# Patient Record
Sex: Female | Born: 1995
Health system: Southern US, Community
[De-identification: ages and names within clinical notes are randomized; demographics above are authoritative.]

## PROBLEM LIST (undated history)

## (undated) ENCOUNTER — Emergency Department (HOSPITAL_BASED_OUTPATIENT_CLINIC_OR_DEPARTMENT_OTHER): Admission: EM | Payer: Medicaid Other | Source: Home / Self Care

## (undated) DIAGNOSIS — D649 Anemia, unspecified: Secondary | ICD-10-CM

## (undated) DIAGNOSIS — R4586 Emotional lability: Secondary | ICD-10-CM

---

## 2015-04-12 ENCOUNTER — Encounter (HOSPITAL_BASED_OUTPATIENT_CLINIC_OR_DEPARTMENT_OTHER): Payer: Self-pay | Admitting: Emergency Medicine

## 2015-04-12 DIAGNOSIS — R42 Dizziness and giddiness: Secondary | ICD-10-CM | POA: Insufficient documentation

## 2015-04-12 NOTE — ED Notes (Signed)
Patient states that she feels dizzy and lightheaded, patient is supposed to be taking iron pills and has not. The patient has not taken these pills for a couple years.

## 2015-04-13 ENCOUNTER — Emergency Department (HOSPITAL_BASED_OUTPATIENT_CLINIC_OR_DEPARTMENT_OTHER)
Admission: EM | Admit: 2015-04-13 | Discharge: 2015-04-13 | Discharge status: Against Medical Advice | Payer: Medicaid Other | Attending: Emergency Medicine | Admitting: Emergency Medicine

## 2015-04-13 HISTORY — DX: Emotional lability: R45.86

## 2015-04-13 HISTORY — DX: Anemia, unspecified: D64.9

## 2015-04-13 NOTE — ED Notes (Signed)
Patient no longer in the waiting room. The patient called x 3 and not in waiting room

## 2016-06-14 ENCOUNTER — Encounter (HOSPITAL_BASED_OUTPATIENT_CLINIC_OR_DEPARTMENT_OTHER): Payer: Self-pay

## 2016-06-14 ENCOUNTER — Emergency Department (HOSPITAL_BASED_OUTPATIENT_CLINIC_OR_DEPARTMENT_OTHER)
Admission: EM | Admit: 2016-06-14 | Discharge: 2016-06-14 | Disposition: A | Discharge status: Home / Self Care | Payer: Medicaid Other | Attending: Emergency Medicine | Admitting: Emergency Medicine

## 2016-06-14 DIAGNOSIS — Y929 Unspecified place or not applicable: Secondary | ICD-10-CM | POA: Insufficient documentation

## 2016-06-14 DIAGNOSIS — Y990 Civilian activity done for income or pay: Secondary | ICD-10-CM | POA: Insufficient documentation

## 2016-06-14 DIAGNOSIS — S1096XA Insect bite of unspecified part of neck, initial encounter: Secondary | ICD-10-CM | POA: Insufficient documentation

## 2016-06-14 DIAGNOSIS — Y939 Activity, unspecified: Secondary | ICD-10-CM | POA: Insufficient documentation

## 2016-06-14 DIAGNOSIS — W57XXXA Bitten or stung by nonvenomous insect and other nonvenomous arthropods, initial encounter: Secondary | ICD-10-CM | POA: Insufficient documentation

## 2016-06-14 DIAGNOSIS — S60861A Insect bite (nonvenomous) of right wrist, initial encounter: Secondary | ICD-10-CM | POA: Insufficient documentation

## 2016-06-14 MED ORDER — PERMETHRIN 5 % EX CREA: TOPICAL_CREAM | CUTANEOUS | 1 refills | Status: DC

## 2016-06-14 MED FILL — PERMETHRIN 5% CREAM: 5 | 14 days supply | Qty: 60 | Fill #0

## 2016-06-14 NOTE — ED Provider Notes (Signed)
[facility]   CSN: [id number] Arrival date & time: 06/14/16  1149     History   Chief Complaint Chief Complaint  Patient presents with  . Insect Bite    HPI Melissa Sandoval is a 21 y.o. female.  The history is provided by the patient and medical records.    21 year old female here with insect bites. States she was at work and saw "little brown bumps" crawling on her. States she was bitten on the wrist in the back for back. States bites are pruritic. States she thinks they are bedbugs but is not entirely sure. She's not had any fever. She has no known allergies.  Past Medical History:  Diagnosis Date  . Anemia   . Mood swings (HCC)     There are no active problems to display for this patient.   History reviewed. No pertinent surgical history.  OB History    No data available       Home Medications    Prior to Admission medications   Not on File    Family History No family history on file.  Social History Social History  Substance Use Topics  . Smoking status: Never Smoker  . Smokeless tobacco: Never Used  . Alcohol use No     Allergies   Patient has no known allergies.   Review of Systems Review of Systems  Skin:       Insect bites  All other systems reviewed and are negative.    Physical Exam Updated Vital Signs BP 127/87 (BP Location: Left Arm)   Pulse 99   Temp 98.5 F (36.9 C) (Oral)   Resp 16   Ht 5\' 1"  (1.549 m)   Wt 63.5 kg   SpO2 100%   BMI 26.45 kg/m   Physical Exam  Constitutional: She is oriented to person, place, and time. She appears well-developed and well-nourished.  HENT:  Head: Normocephalic and atraumatic.  Mouth/Throat: Oropharynx is clear and moist.  Eyes: Conjunctivae and EOM are normal. Pupils are equal, round, and reactive to light.  Neck: Normal range of motion.  Cardiovascular: Normal rate, regular rhythm and normal heart sounds.   Pulmonary/Chest: Effort normal and breath  sounds normal.  Abdominal: Soft. Bowel sounds are normal.  Musculoskeletal: Normal range of motion.  Neurological: She is alert and oriented to person, place, and time.  Skin: Skin is warm and dry.  Small bug bites noted to right wrist and left neck; there are signs of excoriation but no superimposed infection or cellulitis, no lesions on the palms or soles  Psychiatric: She has a normal mood and affect.  Nursing note and vitals reviewed.    ED Treatments / Results  Labs (all labs ordered are listed, but only abnormal results are displayed) Labs Reviewed - No data to display  EKG  EKG Interpretation None       Radiology No results found.  Procedures Procedures (including critical care time)  Medications Ordered in ED Medications - No data to display   Initial Impression / Assessment and Plan / ED Course  I have reviewed the triage vital signs and the nursing notes.  Pertinent labs & imaging results that were available during my care of the patient were reviewed by me and considered in my medical decision making (see chart for details).  21 year old female here with possible bug bite exposure. She has bites of the right wrist and left neck but there are no signs of superimposed infection or  cellulitis. Will treat with permethrin. Recommended Benadryl to help with itching.  Encouraged to wash sheets and other linens in hot water at home.  Discussed plan with patient, she acknowledged understanding and agreed with plan of care.  Return precautions given for new or worsening symptoms.  Final Clinical Impressions(s) / ED Diagnoses   Final diagnoses:  Bug bite, initial encounter    New Prescriptions New Prescriptions   PERMETHRIN (ELIMITE) 5 % CREAM    Apply to entire body other than face - let sit for 14 hours then wash off, may repeat in 1 week if still having symptoms     Garlon HatchetLisa M Roseann Kees, PA-C 06/14/16 1219    Cathren LaineKevin Steinl, MD 06/17/16 [phone]

## 2016-06-14 NOTE — ED Triage Notes (Signed)
[facility]

## 2016-06-14 NOTE — ED Notes (Signed)
ED Provider at bedside. 

## 2016-06-14 NOTE — Discharge Instructions (Signed)
Use medication as directed. Wash your sheets and towels in hot water at home. May need to repeat in 1 week if still having symptoms.

## 2016-08-03 MED FILL — PERMETHRIN 5% CREAM: 5 | 14 days supply | Qty: 60 | Fill #1

## 2016-11-19 ENCOUNTER — Encounter (HOSPITAL_BASED_OUTPATIENT_CLINIC_OR_DEPARTMENT_OTHER): Payer: Self-pay | Admitting: Emergency Medicine

## 2016-11-19 ENCOUNTER — Emergency Department (HOSPITAL_BASED_OUTPATIENT_CLINIC_OR_DEPARTMENT_OTHER)
Admission: EM | Admit: 2016-11-19 | Discharge: 2016-11-20 | Disposition: A | Discharge status: Home / Self Care | Payer: Medicaid Other | Attending: Emergency Medicine | Admitting: Emergency Medicine

## 2016-11-19 DIAGNOSIS — B9689 Other specified bacterial agents as the cause of diseases classified elsewhere: Secondary | ICD-10-CM

## 2016-11-19 DIAGNOSIS — N939 Abnormal uterine and vaginal bleeding, unspecified: Secondary | ICD-10-CM | POA: Insufficient documentation

## 2016-11-19 DIAGNOSIS — N760 Acute vaginitis: Secondary | ICD-10-CM | POA: Insufficient documentation

## 2016-11-19 LAB — PREGNANCY, URINE: Preg Test, Ur: NEGATIVE

## 2016-11-19 NOTE — ED Notes (Signed)
Alert, NAD, calm, interactive, resps e/u, speaking in clear complete sentences, no dyspnea noted, concerned about fibroids.  EDPA into room, prior to RN assessment, see PA notes, orders received. .Marland Kitchen

## 2016-11-19 NOTE — ED Provider Notes (Signed)
[facility]   CSN: [id number] Arrival date & time: 11/19/16  2242  By signing my name below, I, Melissa Sandoval, attest that this documentation has been prepared under the direction and in the presence of [name], PA-C. Electronically Signed: Linna Sandoval, Scribe. 11/19/2016. 11:55 PM.  History   Chief Complaint Chief Complaint  Patient presents with  . Vaginal Bleeding   The history is provided by the patient. No language interpreter was used.    HPI Comments: Melissa Sandoval is a 21 y.o. female with PMHx of anemia who presents to the Emergency Department complaining of intermittent vaginal bleeding for about two weeks. Patient states that she was previously receiving Depo-Provera injections and has had irregular periods since discontinuing the injections last March. Patient also notes some intermittent episodes of abdominal pain independent of her bleeding. She is sexually active but has no concern for pregnancy. She is primarily concerned for fibroids. Patient denies vaginal discharge, dysuria, urinary frequency, nausea, vomiting, or any other associated symptoms.  Past Medical History:  Diagnosis Date  . Anemia   . Mood swings (HCC)     There are no active problems to display for this patient.   History reviewed. No pertinent surgical history.  OB History    No data available       Home Medications    Prior to Admission medications   Medication Sig Start Date End Date Taking? Authorizing Provider  permethrin (ELIMITE) 5 % cream Apply to entire body other than face - let sit for 14 hours then wash off, may repeat in 1 week if still having symptoms 06/14/16   Garlon Hatchet, PA-C    Family History History reviewed. No pertinent family history.  Social History Social History  Substance Use Topics  . Smoking status: Never Smoker  . Smokeless tobacco: Never Used  . Alcohol use No     Allergies   Patient has no known  allergies.   Review of Systems Review of Systems  Gastrointestinal: Positive for abdominal pain. Negative for nausea and vomiting.  Genitourinary: Positive for menstrual problem and vaginal bleeding. Negative for dysuria, frequency and vaginal discharge.  All other systems reviewed and are negative.  Physical Exam Updated Vital Signs LMP 11/19/2016   Physical Exam  Constitutional: She is oriented to person, place, and time. She appears well-developed and well-nourished. No distress.  HENT:  Head: Normocephalic and atraumatic.  Eyes: Conjunctivae and EOM are normal.  Neck: Neck supple. No tracheal deviation present.  Cardiovascular: Normal rate.   Pulmonary/Chest: Effort normal. No respiratory distress.  Abdominal: Soft. Bowel sounds are normal. She exhibits no distension. There is no tenderness. There is no guarding.  Genitourinary:  Genitourinary Comments: Normal external genitalia. Normal vaginal canal. Small blood in vaginal canal with no clots. Cervix is normal, closed. No CMT. No uterine or adnexal tenderness. No masses palpated.    Musculoskeletal: Normal range of motion.  Neurological: She is alert and oriented to person, place, and time.  Skin: Skin is warm and dry.  Psychiatric: She has a normal mood and affect. Her behavior is normal.  Nursing note and vitals reviewed.  ED Treatments / Results  Labs (all labs ordered are listed, but only abnormal results are displayed) Labs Reviewed  PREGNANCY, URINE    EKG  EKG Interpretation None       Radiology No results found.  Procedures Procedures (including critical care time)  COORDINATION OF CARE: 11:54 PM Discussed treatment plan with pt at  bedside and pt agreed to plan.  Medications Ordered in ED Medications - No data to display   Initial Impression / Assessment and Plan / ED Course  I have reviewed the triage vital signs and the nursing notes.  Pertinent labs & imaging results that were available  during my care of the patient were reviewed by me and considered in my medical decision making (see chart for details).     Patient in emergency department with vaginal bleeding after stopping Depo. She is having frequent and irregular periods. Some associated cramping. Patient is in no acute distress at this time. Abdomen is nontender. Slight bleeding on exam. Normal cervix. No cervical motion tenderness. No uterine tenderness or masses palpated. Question dysfunctional bleeding after stopping contraceptives versus fibroids. She is not pregnant. Will start on naproxen for pain and inflammation, will give Flagyl for bacterial vaginosis, follow-up with PCP or OB/GYN for further evaluation of dysfunctional bleeding. Patient denies any dizziness or lightheadedness, do not think she needs any evaluation for anemia. Return precautions discussed.   Vitals:   11/19/16 2330  BP: 120/65  Pulse: 78  Resp: 18  Temp: 98.5 F (36.9 C)  TempSrc: Oral  SpO2: 100%     Final Clinical Impressions(s) / ED Diagnoses   Final diagnoses:  Abnormal uterine bleeding  Bacterial vaginosis    New Prescriptions Discharge Medication List as of 11/20/2016 12:51 AM    START taking these medications   Details  metroNIDAZOLE (FLAGYL) 500 MG tablet Take 1 tablet (500 mg total) by mouth 2 (two) times daily., Starting Mon 11/20/2016, Print       I personally performed the services described in this documentation, which was scribed in my presence. The recorded information has been reviewed and is accurate.    Jaynie CrumbleKirichenko, Laconda Basich, PA-C 11/21/16 0040    Jacalyn LefevreHaviland, Julie, MD 11/23/16 [phone]

## 2016-11-19 NOTE — ED Triage Notes (Addendum)
Patient reports that she has had vaginal bleeding intermittently for the last 2 -3 weeks. Patient stopped going for her Birth Control recently. Reports that she is worried that she has a STD  - patient denies pain today but reports pain in the past. Patient is talking on the phone the entire time in triage

## 2016-11-20 LAB — WET PREP, GENITAL
Sperm: NONE SEEN
Trich, Wet Prep: NONE SEEN
YEAST WET PREP: NONE SEEN

## 2016-11-20 MED ORDER — METRONIDAZOLE 500 MG PO TABS: 500.0000 mg | ORAL_TABLET | Freq: Two times a day (BID) | ORAL | 0 refills | Status: DC

## 2016-11-20 NOTE — Discharge Instructions (Signed)
Take naproxen for any cramping and to slow down the bleeding. Take Flagyl as prescribed until all gone for bacterial vaginosis. Please focally a family doctor for further evaluation and treatment of your bleeding.

## 2016-11-21 LAB — GC/CHLAMYDIA PROBE AMP (~~LOC~~) NOT AT ARMC
Chlamydia: NEGATIVE
NEISSERIA GONORRHEA: NEGATIVE

## 2017-01-24 ENCOUNTER — Emergency Department (HOSPITAL_BASED_OUTPATIENT_CLINIC_OR_DEPARTMENT_OTHER)
Admission: EM | Admit: 2017-01-24 | Discharge: 2017-01-24 | Disposition: A | Discharge status: Home / Self Care | Payer: Self-pay | Attending: Emergency Medicine | Admitting: Emergency Medicine

## 2017-01-24 ENCOUNTER — Encounter (HOSPITAL_BASED_OUTPATIENT_CLINIC_OR_DEPARTMENT_OTHER): Payer: Self-pay | Admitting: *Deleted

## 2017-01-24 DIAGNOSIS — K0889 Other specified disorders of teeth and supporting structures: Secondary | ICD-10-CM | POA: Insufficient documentation

## 2017-01-24 MED ORDER — NAPROXEN 250 MG PO TABS
500.0000 mg | ORAL_TABLET | Freq: Once | ORAL | Status: AC
  Administered 2017-01-24: 500 mg via ORAL
  Filled 2017-01-24: qty 2

## 2017-01-24 MED ORDER — NAPROXEN 500 MG PO TABS: 500.0000 mg | ORAL_TABLET | Freq: Two times a day (BID) | ORAL | 0 refills | Status: AC

## 2017-01-24 MED ORDER — PENICILLIN V POTASSIUM 500 MG PO TABS: 500.0000 mg | ORAL_TABLET | Freq: Four times a day (QID) | ORAL | 0 refills | Status: AC

## 2017-01-24 NOTE — ED Provider Notes (Signed)
[facility]   CSN: [id number] Arrival date & time: 01/24/17  0022     History   Chief Complaint Chief Complaint  Patient presents with  . Dental Pain    HPI Melissa Sandoval is a 21 y.o. female.  HPI  This is a 21 year old female who presents with dental pain. Patient reports several month history of worsening right sided dental pain. She wears braces and had brackets removed thinking that it may be related to that. However, she has had worsening pain. Worsening of last 2 weeks. She has also previously been treated for dental infection but has not followed up with her dentist. She denies any difficulty swallowing. She is not taking anything for her pain. Currently she rates her pain a 10 out of 10.  Past Medical History:  Diagnosis Date  . Anemia   . Mood swings (HCC)     There are no active problems to display for this patient.   History reviewed. No pertinent surgical history.  OB History    No data available       Home Medications    Prior to Admission medications   Medication Sig Start Date End Date Taking? Authorizing Provider  naproxen (NAPROSYN) 500 MG tablet Take 1 tablet (500 mg total) by mouth 2 (two) times daily. 01/24/17   Zakarie Sturdivant, Mayer Masker, MD  penicillin v potassium (VEETID) 500 MG tablet Take 1 tablet (500 mg total) by mouth 4 (four) times daily. 01/24/17 01/31/17  Lilliona Blakeney, Mayer Masker, MD    Family History History reviewed. No pertinent family history.  Social History Social History  Substance Use Topics  . Smoking status: Never Smoker  . Smokeless tobacco: Never Used  . Alcohol use No     Allergies   Patient has no known allergies.   Review of Systems Review of Systems  HENT: Positive for dental problem. Negative for trouble swallowing.   All other systems reviewed and are negative.    Physical Exam Updated Vital Signs BP 112/73   Pulse 76   Temp 98.6 F (37 C)   Resp 16   Ht  (1.549 m)   Wt 73.9  kg (163 lb)   SpO2 100%   BMI 30.80 kg/m   Physical Exam  Constitutional: She is oriented to person, place, and time. She appears well-developed and well-nourished. No distress.  HENT:  Head: Normocephalic and atraumatic.  Brace is noted without brackets in place, generalized periodontal disease, no tenderness along the gumline, no obvious abscess, no trismus, no fullness under the tongue  Cardiovascular: Normal rate and regular rhythm.   Pulmonary/Chest: Effort normal. No respiratory distress.  Neurological: She is alert and oriented to person, place, and time.  Skin: Skin is warm and dry.  Psychiatric: She has a normal mood and affect.  Nursing note and vitals reviewed.    ED Treatments / Results  Labs (all labs ordered are listed, but only abnormal results are displayed) Labs Reviewed - No data to display  EKG  EKG Interpretation None       Radiology No results found.  Procedures Procedures (including critical care time)  Medications Ordered in ED Medications  naproxen (NAPROSYN) tablet 500 mg (not administered)     Initial Impression / Assessment and Plan / ED Course  I have reviewed the triage vital signs and the nursing notes.  Pertinent labs & imaging results that were available during my care of the patient were reviewed by me and considered in  my medical decision making (see chart for details).     Patient presents with dental pain. She has not followed up with dentistry. She has evidence of periodontal disease but no obvious abscess. No signs or symptoms of Ludwigs.  Recommend naproxen and antibiotics for possible underlying infection. Recommend close follow-up with dentistry.  After history, exam, and medical workup I feel the patient has been appropriately medically screened and is safe for discharge home. Pertinent diagnoses were discussed with the patient. Patient was given return precautions.   Final Clinical Impressions(s) / ED Diagnoses    Final diagnoses:  Pain, dental    New Prescriptions New Prescriptions   NAPROXEN (NAPROSYN) 500 MG TABLET    Take 1 tablet (500 mg total) by mouth 2 (two) times daily.   PENICILLIN V POTASSIUM (VEETID) 500 MG TABLET    Take 1 tablet (500 mg total) by mouth 4 (four) times daily.     Shon BatonHorton, Sanayah Munro F, MD 01/24/17 [phone]

## 2017-01-24 NOTE — ED Triage Notes (Signed)
Pt c/o dental pain x " months"

## 2017-12-11 ENCOUNTER — Encounter (HOSPITAL_BASED_OUTPATIENT_CLINIC_OR_DEPARTMENT_OTHER): Payer: Self-pay | Admitting: *Deleted

## 2017-12-11 ENCOUNTER — Emergency Department (HOSPITAL_BASED_OUTPATIENT_CLINIC_OR_DEPARTMENT_OTHER)
Admission: EM | Admit: 2017-12-11 | Discharge: 2017-12-11 | Disposition: A | Discharge status: Home / Self Care | Payer: Medicaid Other | Attending: Emergency Medicine | Admitting: Emergency Medicine

## 2017-12-11 ENCOUNTER — Other Ambulatory Visit: Payer: Self-pay

## 2017-12-11 DIAGNOSIS — R42 Dizziness and giddiness: Secondary | ICD-10-CM | POA: Diagnosis not present

## 2017-12-11 DIAGNOSIS — Z3202 Encounter for pregnancy test, result negative: Secondary | ICD-10-CM | POA: Diagnosis not present

## 2017-12-11 LAB — URINALYSIS, ROUTINE W REFLEX MICROSCOPIC
BILIRUBIN URINE: NEGATIVE
Glucose, UA: NEGATIVE mg/dL
Hgb urine dipstick: NEGATIVE
Ketones, ur: NEGATIVE mg/dL
Leukocytes, UA: NEGATIVE
NITRITE: NEGATIVE
PH: 6 (ref 5.0–8.0)
Protein, ur: NEGATIVE mg/dL
SPECIFIC GRAVITY, URINE: 1.015 (ref 1.005–1.030)

## 2017-12-11 LAB — PREGNANCY, URINE: Preg Test, Ur: NEGATIVE

## 2017-12-11 NOTE — ED Provider Notes (Signed)
Emergency Department Provider Note   I have reviewed the triage vital signs and the nursing notes.   HISTORY  Chief Complaint Dizziness   HPI Melissa Sandoval is a [age] y.o. female without significant medical problems aside from anemia the presents the emergency department today secondary to an episode of dizziness.  Patient states that she walked into work which is usually relatively cool with fans but today it was really hot after working a little bit she got dizzy.  She sat down and rested and improved.  She had no other associated symptoms.  No extremity weakness, chest pain, cough, shortness of breath or other symptoms.  She called her mom who told her to come here to be evaluated as she has a history of anemia.  No recent illnesses.  She states she is been urinating which she feels is normal.  She does have chronic constipation but no diarrhea. No other associated or modifying symptoms.    Past Medical History:  Diagnosis Date  . Anemia   . Mood swings     There are no active problems to display for this patient.   History reviewed. No pertinent surgical history.  Current Outpatient Rx  . Order #: [id number] Class: Print    Allergies Patient has no known allergies.  No family history on file.  Social History Social History   Tobacco Use  . Smoking status: Never Smoker  . Smokeless tobacco: Never Used  Substance Use Topics  . Alcohol use: No  . Drug use: No    Review of Systems  All other systems negative except as documented in the HPI. All pertinent positives and negatives as reviewed in the HPI. ____________________________________________   PHYSICAL EXAM:  VITAL SIGNS: ED Triage Vitals  Enc Vitals Group     BP 12/11/17 1954 (!) 131/95     Pulse Rate 12/11/17 1954 71     Resp 12/11/17 1954 16     Temp 12/11/17 1954 98.1 F (36.7 C)     Temp Source 12/11/17 1954 Oral     SpO2 12/11/17 1954 100 %     Weight 12/11/17 1953 173 lb (78.5 kg)   Height 12/11/17 1953 5\' 1"  (1.549 m)    Constitutional: Alert and oriented. Well appearing and in no acute distress. Eyes: Conjunctivae are normal. PERRL. EOMI. Head: Atraumatic. Nose: No congestion/rhinnorhea. Mouth/Throat: Mucous membranes are moist.  Oropharynx non-erythematous. Neck: No stridor.  No meningeal signs.   Cardiovascular: Normal rate, regular rhythm. Good peripheral circulation. Grossly normal heart sounds.   Respiratory: Normal respiratory effort.  No retractions. Lungs CTAB. Gastrointestinal: Soft and nontender. No distention.  Musculoskeletal: No lower extremity tenderness nor edema. No gross deformities of extremities. Neurologic:  Normal speech and language. No gross focal neurologic deficits are appreciated.  Skin:  Skin is warm, dry and intact. No rash noted.   ____________________________________________   LABS (all labs ordered are listed, but only abnormal results are displayed)  Labs Reviewed  PREGNANCY, URINE  URINALYSIS, ROUTINE W REFLEX MICROSCOPIC   ____________________________________________   INITIAL IMPRESSION / ASSESSMENT AND PLAN / ED COURSE  Brief episode of dizziness and heat which is since resolved.  Patient is concerned she might be anemic however her heart rate is normal, blood pressure is normal she will ambulate here without difficulty and a normal neurologic exam.  Her hemoglobin may be a little bit low but I doubt its significantly low.  At this time I think patient is stable for discharge to follow-up with  her PCP if the symptoms continue.   Pertinent labs & imaging results that were available during my care of the patient were reviewed by me and considered in my medical decision making (see chart for details).  ____________________________________________  FINAL CLINICAL IMPRESSION(S) / ED DIAGNOSES  Final diagnoses:  Lightheadedness     MEDICATIONS GIVEN DURING THIS VISIT:  Medications - No data to display   NEW  OUTPATIENT MEDICATIONS STARTED DURING THIS VISIT:  New Prescriptions   No medications on file    Note:  This note was prepared with assistance of Dragon voice recognition software. Occasional wrong-word or sound-a-like substitutions may have occurred due to the inherent limitations of voice recognition software.   Marily Memos, MD 12/11/17 2036

## 2017-12-11 NOTE — ED Triage Notes (Signed)
Dizziness since this evening. She is not dizzy now. No pain. She is ambulatory.

## 2017-12-11 NOTE — ED Notes (Signed)
ED Provider at bedside. 

## 2017-12-11 NOTE — ED Notes (Signed)
Pt reports she does not have a large water intake.

## 2018-07-25 ENCOUNTER — Emergency Department (HOSPITAL_BASED_OUTPATIENT_CLINIC_OR_DEPARTMENT_OTHER)
Admission: EM | Admit: 2018-07-25 | Discharge: 2018-07-25 | Disposition: A | Discharge status: Home / Self Care | Payer: Medicaid Other | Attending: Emergency Medicine | Admitting: Emergency Medicine

## 2018-07-25 ENCOUNTER — Other Ambulatory Visit: Payer: Self-pay

## 2018-07-25 ENCOUNTER — Encounter (HOSPITAL_BASED_OUTPATIENT_CLINIC_OR_DEPARTMENT_OTHER): Payer: Self-pay | Admitting: *Deleted

## 2018-07-25 DIAGNOSIS — L404 Guttate psoriasis: Secondary | ICD-10-CM | POA: Insufficient documentation

## 2018-07-25 DIAGNOSIS — R21 Rash and other nonspecific skin eruption: Secondary | ICD-10-CM | POA: Diagnosis present

## 2018-07-25 MED ORDER — TRIAMCINOLONE ACETONIDE 0.1 % EX OINT: 1.0000 "application " | TOPICAL_OINTMENT | Freq: Two times a day (BID) | CUTANEOUS | 0 refills | Status: AC

## 2018-07-25 MED FILL — TRIAMCINOLONE 0.1% OINTMEN: 0.1 | 14 days supply | Qty: 30 | Fill #0

## 2018-07-25 NOTE — Discharge Instructions (Addendum)
You were seen in the emergency department for a rash. You were prescribed a topical steroid. Please use this for no more than 1-2 weeks in a row. Please follow up with your regular doctor if it does not improve.

## 2018-07-25 NOTE — ED Notes (Signed)
Fine to medium rash over body x 2 weeks  Itching at times  Denies pain

## 2018-07-25 NOTE — ED Triage Notes (Signed)
Pt stated that she is breaking out everywhere, looks like ringworm she stated, but she doesn't think that it is.

## 2018-07-25 NOTE — ED Provider Notes (Signed)
[facility] EMERGENCY DEPARTMENT Provider Note   CSN: [id number] Arrival date & time: 07/25/18  1015    History   Chief Complaint No chief complaint on file.   HPI Melissa Sandoval is a 23 y.o. female.     HPI  Patient was in her usual state of health until 2 weeks ago when she noticed small 1 cm lesions under her arms that were mildly pruritic. Lesions gradually worsened over the last 2 weeks and today her boyfriend noticed them on her back which prompted her to come in to be seen.  She was recently ill with fevers and URI symptoms which started one week ago. She is no longer febrile and no longer having infectious symptoms. She has recently purchased new bedding from [employer], but otherwise denies new exposures including lotions, medicines, detergents or soaps. She reports trying topical 1%hydrocortisone lotion, vasoline, and cera-ve lotion over the rash without improvement.  Past Medical History:  Diagnosis Date  . Anemia   . Mood swings     There are no active problems to display for this patient.   History reviewed. No pertinent surgical history.   OB History   No obstetric history on file.      Home Medications    Prior to Admission medications   Medication Sig Start Date End Date Taking? Authorizing Provider  naproxen (NAPROSYN) 500 MG tablet Take 1 tablet (500 mg total) by mouth 2 (two) times daily. 01/24/17   Horton, Mayer Masker, MD  triamcinolone ointment (KENALOG) 0.1 % Apply 1 application topically 2 (two) times daily. Do not use for more than 7 days in a row. 07/25/18   Howard Pouch, MD    Family History History reviewed. No pertinent family history.  Social History Social History   Tobacco Use  . Smoking status: Never Smoker  . Smokeless tobacco: Never Used  Substance Use Topics  . Alcohol use: No  . Drug use: No     Allergies   Patient has no known allergies.   Review of Systems Review of Systems  Constitutional: Negative for  activity change and fever.  HENT: Negative for congestion and rhinorrhea.   Respiratory: Negative for shortness of breath and wheezing.   Gastrointestinal: Negative for abdominal pain, diarrhea and nausea.  Genitourinary: Negative for frequency.  Neurological: Negative for headaches.     Physical Exam Updated Vital Signs BP 110/73 (BP Location: Left Arm)   Pulse 68   Temp 98.2 F (36.8 C) (Oral)   Resp 18   Ht 5\' 1"  (1.549 m)   Wt 81.6 kg   SpO2 100%   BMI 34.01 kg/m   Physical Exam GEN: NAD, alert, cooperative, and pleasant. RESPIRATORY: Comfortable work of breathing, speaks in full sentences CV: Regular rate noted, distal extremities well perfused and warm without edema GI: Soft, nondistended SKIN: warm and dry, no rashes or lesions NEURO: II-XII grossly intact MSK: Moves 4 extremities equally PSYCH: AAOx3, appropriate affect SKIN: numerous well-circumscribed ~1-cm flat oval plaques with overlying lacy skin noted in bilateral axillary region, no excoriations or overlying erythema. No lesions between the fingers or on the palms or soles. No lesions at the wrists.  ED Treatments / Results  Labs (all labs ordered are listed, but only abnormal results are displayed) Labs Reviewed - No data to display EKG None  Radiology No results found.  Procedures Procedures (including critical care time)  Medications Ordered in ED Medications - No data to display   Initial Impression / Assessment  and Plan / ED Course  I have reviewed the triage vital signs and the nursing notes.  Pertinent labs & imaging results that were available during my care of the patient were reviewed by me and considered in my medical decision making (see chart for details).       Rash - due to numerous lesions in bilateral axilla, seems less likely fungal and after use of topical steroid KOH test may not be useful and would be difficult to obtain in the ED. 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  - triamcinolone ointment 0.1% to be used BID for no more than 2 weeks in a row - informed patient of risk of hypopigmentation with prolonged steroid use - follow up with PCP if symptoms persist or fail to improve  Final Clinical Impressions(s) / ED Diagnoses   Final diagnoses:  Guttate psoriasis    ED Discharge Orders         Ordered    triamcinolone ointment (KENALOG) 0.1 %  2 times daily     07/25/18 1130           Howard Pouch, MD 07/25/18 1137    Blane Ohara, MD 07/25/18 [phone]

## 2018-09-25 ENCOUNTER — Emergency Department (HOSPITAL_BASED_OUTPATIENT_CLINIC_OR_DEPARTMENT_OTHER)
Admission: EM | Admit: 2018-09-25 | Discharge: 2018-09-25 | Disposition: A | Discharge status: Home / Self Care | Payer: Medicaid Other | Attending: Emergency Medicine | Admitting: Emergency Medicine

## 2018-09-25 ENCOUNTER — Other Ambulatory Visit: Payer: Self-pay

## 2018-09-25 ENCOUNTER — Encounter (HOSPITAL_BASED_OUTPATIENT_CLINIC_OR_DEPARTMENT_OTHER): Payer: Self-pay | Admitting: Emergency Medicine

## 2018-09-25 ENCOUNTER — Emergency Department (HOSPITAL_BASED_OUTPATIENT_CLINIC_OR_DEPARTMENT_OTHER): Payer: Medicaid Other

## 2018-09-25 DIAGNOSIS — L03213 Periorbital cellulitis: Secondary | ICD-10-CM | POA: Insufficient documentation

## 2018-09-25 DIAGNOSIS — E876 Hypokalemia: Secondary | ICD-10-CM | POA: Insufficient documentation

## 2018-09-25 DIAGNOSIS — R600 Localized edema: Secondary | ICD-10-CM | POA: Diagnosis present

## 2018-09-25 LAB — CBC WITH DIFFERENTIAL/PLATELET
Abs Immature Granulocytes: 0.02 10*3/uL (ref 0.00–0.07)
Basophils Absolute: 0 10*3/uL (ref 0.0–0.1)
Basophils Relative: 0 %
Eosinophils Absolute: 0 10*3/uL (ref 0.0–0.5)
Eosinophils Relative: 1 %
HCT: 37.1 % (ref 36.0–46.0)
Hemoglobin: 11.4 g/dL — ABNORMAL LOW (ref 12.0–15.0)
Immature Granulocytes: 0 %
Lymphocytes Relative: 32 %
Lymphs Abs: 1.8 10*3/uL (ref 0.7–4.0)
MCH: 24.2 pg — ABNORMAL LOW (ref 26.0–34.0)
MCHC: 30.7 g/dL (ref 30.0–36.0)
MCV: 78.6 fL — ABNORMAL LOW (ref 80.0–100.0)
Monocytes Absolute: 0.6 10*3/uL (ref 0.1–1.0)
Monocytes Relative: 10 %
Neutro Abs: 3.3 10*3/uL (ref 1.7–7.7)
Neutrophils Relative %: 57 %
Platelets: 287 10*3/uL (ref 150–400)
RBC: 4.72 MIL/uL (ref 3.87–5.11)
RDW: 14.6 % (ref 11.5–15.5)
WBC: 5.8 10*3/uL (ref 4.0–10.5)
nRBC: 0 % (ref 0.0–0.2)

## 2018-09-25 LAB — BASIC METABOLIC PANEL
Anion gap: 8 (ref 5–15)
BUN: 9 mg/dL (ref 6–20)
CO2: 23 mmol/L (ref 22–32)
Calcium: 9 mg/dL (ref 8.9–10.3)
Chloride: 109 mmol/L (ref 98–111)
Creatinine, Ser: 0.59 mg/dL (ref 0.44–1.00)
GFR calc Af Amer: >60 mL/min (ref >60)
GFR calc non Af Amer: >60 mL/min (ref >60)
Glucose, Bld: 150 mg/dL — ABNORMAL HIGH (ref 70–99)
Potassium: 3 mmol/L — ABNORMAL LOW (ref 3.5–5.1)
Sodium: 140 mmol/L (ref 135–145)

## 2018-09-25 LAB — PREGNANCY, URINE: Preg Test, Ur: NEGATIVE

## 2018-09-25 MED ORDER — IOHEXOL 300 MG/ML  SOLN
100.0000 mL | Freq: Once | INTRAMUSCULAR | Status: AC | PRN
  Administered 2018-09-25: 14:00:00 75 mL via INTRAVENOUS

## 2018-09-25 MED ORDER — POTASSIUM CHLORIDE 10 MEQ/100ML IV SOLN
10.0000 meq | Freq: Once | INTRAVENOUS | Status: AC
  Administered 2018-09-25: 10 meq via INTRAVENOUS
  Filled 2018-09-25: qty 100

## 2018-09-25 MED ORDER — SULFAMETHOXAZOLE-TRIMETHOPRIM 800-160 MG PO TABS: 1.0000 | ORAL_TABLET | Freq: Two times a day (BID) | ORAL | 0 refills | Status: AC

## 2018-09-25 MED ORDER — AMOXICILLIN 875 MG PO TABS: 875.0000 mg | ORAL_TABLET | Freq: Two times a day (BID) | ORAL | 0 refills | Status: AC

## 2018-09-25 MED ORDER — SODIUM CHLORIDE 0.9 % IV SOLN
INTRAVENOUS | Status: DC | PRN
  Administered 2018-09-25: 500 mL via INTRAVENOUS

## 2018-09-25 MED FILL — AMOXICILLIN 875 MG TABS: 875 | 10 days supply | Qty: 20 | Fill #0

## 2018-09-25 MED FILL — SULFAMETHOXAZOLE-TMP DS TAB: 800-160 | 10 days supply | Qty: 20 | Fill #0

## 2018-09-25 NOTE — Discharge Instructions (Signed)
You were seen in the ED today for facial swelling; your CT scan showed preseptal cellulitis of your right eye; please take both antibiotics as prescribed for the next 10 days. If not improvement in symptoms after 24 hours of being on antibiotics you should return to the ED. Please follow up with your PCP in 1 week for recheck of your potassium level as this was slightly low in the ED today.

## 2018-09-25 NOTE — ED Provider Notes (Signed)
[facility] EMERGENCY DEPARTMENT Provider Note   CSN: [id number] Arrival date & time: 09/25/18  1228    History   Chief Complaint Chief Complaint  Patient presents with  . Facial Swelling    HPI Melissa Sandoval is a 23 y.o. female who presents to the ED complaining of right periorbital edema that began this morning. Pt reports she had irritation to her eye yesterday and rubbed it excessively and when she woke up this morning she noticed the swelling around her eye. Denies any pain with movement of her eye. No vision changes; pt wears glasses for corrective reasons. No recent URI like symptoms. Denies fever, chills, ear pain, sore throat, sinus pressure, or any other associated symptoms.        Past Medical History:  Diagnosis Date  . Anemia   . Mood swings     There are no active problems to display for this patient.   History reviewed. No pertinent surgical history.   OB History   No obstetric history on file.      Home Medications    Prior to Admission medications   Medication Sig Start Date End Date Taking? Authorizing Provider  amoxicillin (AMOXIL) 875 MG tablet Take 1 tablet (875 mg total) by mouth 2 (two) times daily for 10 days. 09/25/18 10/05/18  Hyman Hopes, Delpha Perko, PA-C  naproxen (NAPROSYN) 500 MG tablet Take 1 tablet (500 mg total) by mouth 2 (two) times daily. 01/24/17   Horton, Mayer Masker, MD  sulfamethoxazole-trimethoprim (BACTRIM DS) 800-160 MG tablet Take 1 tablet by mouth 2 (two) times daily for 10 days. 09/25/18 10/05/18  Tanda Rockers, PA-C  triamcinolone ointment (KENALOG) 0.1 % Apply 1 application topically 2 (two) times daily. Do not use for more than 7 days in a row. 07/25/18   Howard Pouch, MD    Family History No family history on file.  Social History Social History   Tobacco Use  . Smoking status: Never Smoker  . Smokeless tobacco: Never Used  Substance Use Topics  . Alcohol use: No  . Drug use: No     Allergies   Patient  has no known allergies.   Review of Systems Review of Systems  Constitutional: Negative for chills and fever.  HENT: Negative for congestion, ear pain, rhinorrhea, sinus pressure, sinus pain, sore throat, trouble swallowing and voice change.   Eyes: Positive for discharge. Negative for pain and visual disturbance.       + periorbital swelling to right eye  Respiratory: Negative for cough.      Physical Exam Updated Vital Signs BP 109/62 (BP Location: Right Arm)   Pulse 73   Temp 99 F (37.2 C) (Oral)   Resp 18   SpO2 100%   Physical Exam Vitals signs and nursing note reviewed.  Constitutional:      Appearance: She is not ill-appearing.  HENT:     Head: Normocephalic and atraumatic.     Right Ear: Tympanic membrane normal.     Left Ear: Tympanic membrane normal.     Nose: Nose normal.  Eyes:     Extraocular Movements: Extraocular movements intact.     Conjunctiva/sclera: Conjunctivae normal.     Pupils: Pupils are equal, round, and reactive to light.     Comments: Periorbital edema to right eye, greater to top lid compared to lower lid. No pain with palpation. No pain with EOMs. Conjunctiva clear without injection.   Neck:     Musculoskeletal: Neck supple.  Cardiovascular:  Rate and Rhythm: Normal rate and regular rhythm.  Pulmonary:     Effort: Pulmonary effort is normal.     Breath sounds: Normal breath sounds.  Abdominal:     Palpations: Abdomen is soft.     Tenderness: There is no abdominal tenderness.  Skin:    General: Skin is warm and dry.  Neurological:     Mental Status: She is alert.      ED Treatments / Results  Labs (all labs ordered are listed, but only abnormal results are displayed) Labs Reviewed  BASIC METABOLIC PANEL - Abnormal; Notable for the following components:      Result Value   Potassium 3.0 (*)    Glucose, Bld 150 (*)    All other components within normal limits  CBC WITH DIFFERENTIAL/PLATELET - Abnormal; Notable for the  following components:   Hemoglobin 11.4 (*)    MCV 78.6 (*)    MCH 24.2 (*)    All other components within normal limits  PREGNANCY, URINE    EKG None  Radiology Ct Orbits W Contrast  Result Date: 09/25/2018 CLINICAL DATA:  Periorbital cellulitis. Right eye swelling. EXAM: CT ORBITS WITH CONTRAST TECHNIQUE: Multidetector CT images was performed according to the standard protocol following intravenous contrast administration. CONTRAST:  75mL OMNIPAQUE IOHEXOL 300 MG/ML  SOLN COMPARISON:  None. FINDINGS: Orbits: The globes appear intact. There is mild right periorbital soft tissue swelling. No abscess or postseptal inflammation is identified. Visualized sinuses: No significant inflammatory changes in the paranasal sinuses. Soft tissues: No additional findings. Limited intracranial: Unremarkable. IMPRESSION: Mild right periorbital soft tissue swelling consistent with cellulitis. No evidence of postseptal cellulitis or abscess. Electronically Signed   By: Sebastian AcheAllen  Grady M.D.   On: 09/25/2018 14:33    Procedures Procedures (including critical care time)  Medications Ordered in ED Medications  potassium chloride 10 mEq in 100 mL IVPB ( Intravenous Rate/Dose Change 09/25/18 1505)  0.9 %  sodium chloride infusion (500 mLs Intravenous New Bag/Given 09/25/18 1501)  iohexol (OMNIPAQUE) 300 MG/ML solution 100 mL (75 mLs Intravenous Contrast Given 09/25/18 1411)     Initial Impression / Assessment and Plan / ED Course  I have reviewed the triage vital signs and the nursing notes.  Pertinent labs & imaging results that were available during my care of the patient were reviewed by me and considered in my medical decision making (see chart for details).    Pt is a healthy 23 year old female who presents with right periorbital edema that she noticed this morning. Pt denies pain at rest or with EOMs. No vision changes; wears corrective lenses. Low suspicion for orbital cellulitis but will get CT orbit to  rule out orbital cellulitis vs preorbital cellulitis. Baseline labs ordered as well to check for elevated WBCs. Will reevaluate once labs return.   No leukocytosis to suggest infectious process; pt mildly hypokalemic at 3.0; will replete in the ED today and send patient home with small course outpatient. CT scan shows preorbital cellulitis; will treat outpatient with bactrim and amoxicillin as patient has no allergy; suggest follow up with PCP next week for repeat potassium level. Pt in agreement with plan at this time.       Final Clinical Impressions(s) / ED Diagnoses   Final diagnoses:  Preseptal cellulitis of right eye  Hypokalemia    ED Discharge Orders         Ordered    sulfamethoxazole-trimethoprim (BACTRIM DS) 800-160 MG tablet  2 times daily  09/25/18 1528    amoxicillin (AMOXIL) 875 MG tablet  2 times daily     09/25/18 1528           Tanda Rockers, PA-C 09/25/18 1729    Long, Arlyss Repress, MD 09/25/18 2025

## 2018-09-25 NOTE — ED Notes (Signed)
Patient transported to CT 

## 2018-09-25 NOTE — ED Triage Notes (Signed)
Right periorbital edema today, denies visual changes or pain, has had watery drainage .

## 2018-09-25 NOTE — ED Notes (Signed)
ED Provider at bedside. 

## 2019-08-29 ENCOUNTER — Encounter (HOSPITAL_BASED_OUTPATIENT_CLINIC_OR_DEPARTMENT_OTHER): Payer: Self-pay | Admitting: Emergency Medicine

## 2019-08-29 ENCOUNTER — Emergency Department (HOSPITAL_BASED_OUTPATIENT_CLINIC_OR_DEPARTMENT_OTHER)
Admission: EM | Admit: 2019-08-29 | Discharge: 2019-08-29 | Disposition: A | Discharge status: Home / Self Care | Payer: Medicaid Other | Attending: Emergency Medicine | Admitting: Emergency Medicine

## 2019-08-29 ENCOUNTER — Other Ambulatory Visit: Payer: Self-pay

## 2019-08-29 DIAGNOSIS — J039 Acute tonsillitis, unspecified: Secondary | ICD-10-CM | POA: Diagnosis not present

## 2019-08-29 DIAGNOSIS — J029 Acute pharyngitis, unspecified: Secondary | ICD-10-CM | POA: Diagnosis present

## 2019-08-29 LAB — GROUP A STREP BY PCR: Group A Strep by PCR: NOT DETECTED

## 2019-08-29 NOTE — ED Triage Notes (Signed)
Pt c/o sore throat, cough, runny nose, congestion for 3 days.  No known fever.  Tonsils enlarged, red and noted white patches.

## 2019-08-29 NOTE — ED Notes (Signed)
C/o runny nose, congestion and sore throat x 3 days  But denies pain

## 2019-09-03 NOTE — ED Provider Notes (Signed)
[facility] EMERGENCY DEPARTMENT Provider Note   CSN: [id number] Arrival date & time: 08/29/19  1751     History Chief Complaint  Patient presents with  . Sore Throat    Melissa Sandoval is a 24 y.o. female.  HPI   24 year old female with sore throat, cough, runny nose and congestion.  Onset 3 days ago.  She felt like she had a fever but I assume actually checking her temperature.  Throat feels inflamed.  She has noticed white patches on her tonsils.  She initially felt that symptoms not improved but is presenting today since they have not.  Past Medical History:  Diagnosis Date  . Anemia   . Mood swings     There are no problems to display for this patient.   History reviewed. No pertinent surgical history.   OB History   No obstetric history on file.     History reviewed. No pertinent family history.  Social History   Tobacco Use  . Smoking status: Never Smoker  . Smokeless tobacco: Never Used  Substance Use Topics  . Alcohol use: No  . Drug use: No    Home Medications Prior to Admission medications   Medication Sig Start Date End Date Taking? Authorizing Provider  naproxen (NAPROSYN) 500 MG tablet Take 1 tablet (500 mg total) by mouth 2 (two) times daily. 01/24/17   Horton, Barbette Hair, MD  triamcinolone ointment (KENALOG) 0.1 % Apply 1 application topically 2 (two) times daily. Do not use for more than 7 days in a row. 07/25/18   Everrett Coombe, MD    Allergies    Patient has no known allergies.  Review of Systems   Review of Systems All systems reviewed and negative, other than as noted in HPI.  Physical Exam Updated Vital Signs BP 114/77   Pulse 68   Temp 98.7 F (37.1 C) (Oral)   Resp 18   Ht 5\' 1"  (1.549 m)   Wt 72.6 kg   SpO2 100%   BMI 30.23 kg/m   Physical Exam Vitals and nursing note reviewed.  Constitutional:      General: She is not in acute distress.    Appearance: She is well-developed.  HENT:     Head: Normocephalic  and atraumatic.     Mouth/Throat:     Pharynx: Oropharyngeal exudate present.     Comments: Tonsils mildly enlarged, erythematous with exudate.  Uvula midline.  Normal sounding voice.  Neck supple.  Handling secretions.  No stridor. Eyes:     General:        Right eye: No discharge.        Left eye: No discharge.     Conjunctiva/sclera: Conjunctivae normal.  Cardiovascular:     Rate and Rhythm: Normal rate and regular rhythm.     Heart sounds: Normal heart sounds. No murmur. No friction rub. No gallop.   Pulmonary:     Effort: Pulmonary effort is normal. No respiratory distress.     Breath sounds: Normal breath sounds.  Abdominal:     General: There is no distension.     Palpations: Abdomen is soft.     Tenderness: There is no abdominal tenderness.  Musculoskeletal:        General: No tenderness.     Cervical back: Neck supple.  Skin:    General: Skin is warm and dry.  Neurological:     Mental Status: She is alert.  Psychiatric:        Behavior:  Behavior normal.        Thought Content: Thought content normal.     ED Results / Procedures / Treatments   Labs (all labs ordered are listed, but only abnormal results are displayed) Labs Reviewed  GROUP A STREP BY PCR    EKG None  Radiology No results found.  Procedures Procedures (including critical care time)  Medications Ordered in ED Medications - No data to display  ED Course  I have reviewed the triage vital signs and the nursing notes.  Pertinent labs & imaging results that were available during my care of the patient were reviewed by me and considered in my medical decision making (see chart for details).    MDM Rules/Calculators/A&P                      24 year old female with tonsillitis.  Possibly viral.  Strep negative.  Plan symptomatic treatment.  No exam evidence of serious deep space infection.  Return precautions discussed.  Symptomatic treatment otherwise. Final Clinical Impression(s) / ED  Diagnoses Final diagnoses:  Tonsillitis    Rx / DC Orders ED Discharge Orders    None       Raeford Razor, MD 09/03/19 [id number]

## 2020-09-02 IMAGING — CT CT ORBITS WITH CONTRAST
3 series · 15 of 47 positions shown, 18 images · IV contrast (omnipaque)
Comparison: None.

CLINICAL DATA: Periorbital cellulitis. Right eye swelling.

EXAM:
CT ORBITS WITH CONTRAST
TECHNIQUE: Multidetector CT images was performed according to the standard
protocol following intravenous contrast administration.
CONTRAST:  75mL OMNIPAQUE IOHEXOL 300 MG/ML  SOLN

[Series 3: orbits 2.0 h30s st · axial · 0.32mm/px · z∈[+1097,+1179]mm · 9 of 49 slices shown, 12 images]
[im 4/49  brain]
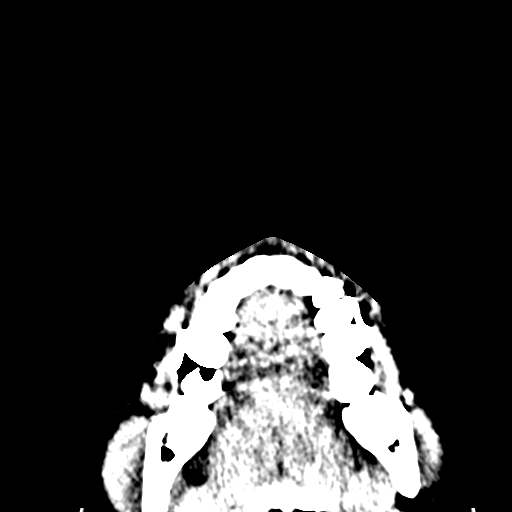
[im 4/49  bone]
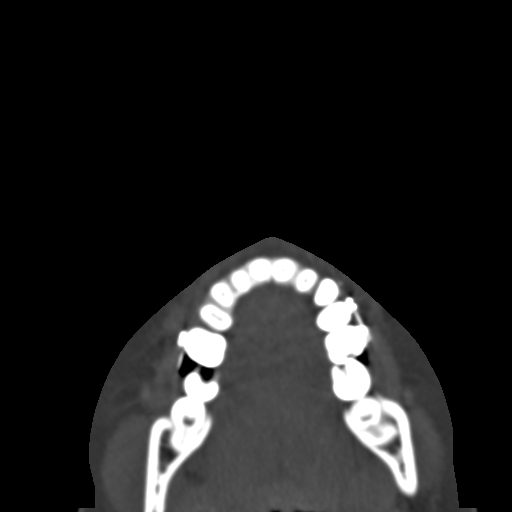
[im 9/49  bone]
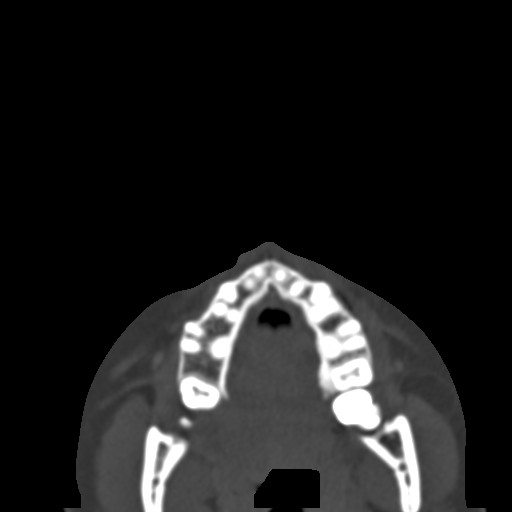
[im 14/49  bone]
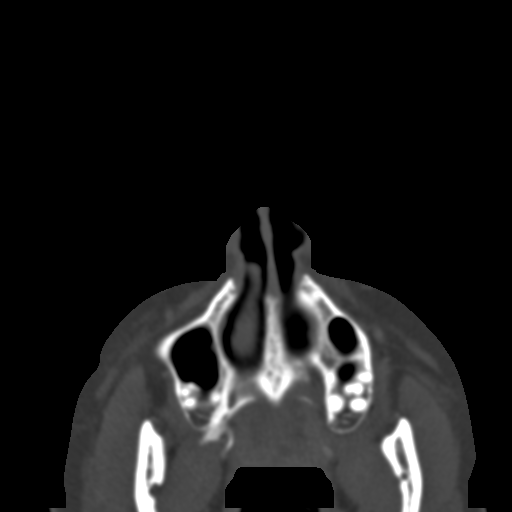
[im 19/49  bone]
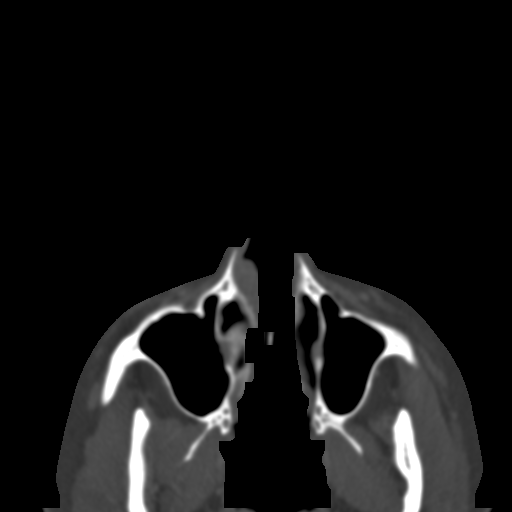
[im 25/49  brain]
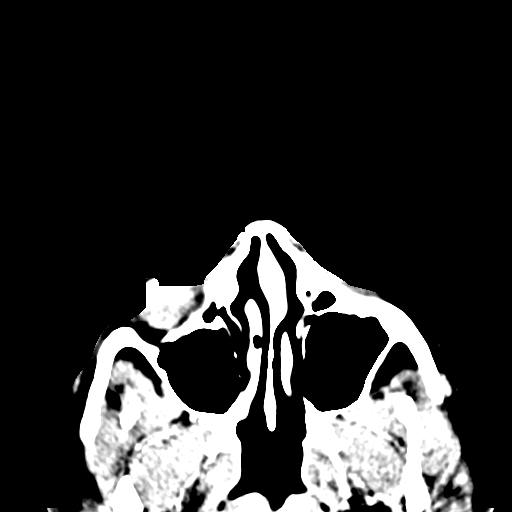
[im 25/49  bone]
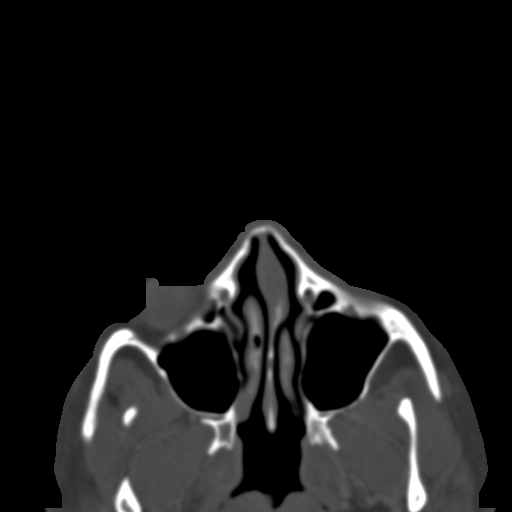
[im 30/49  bone]
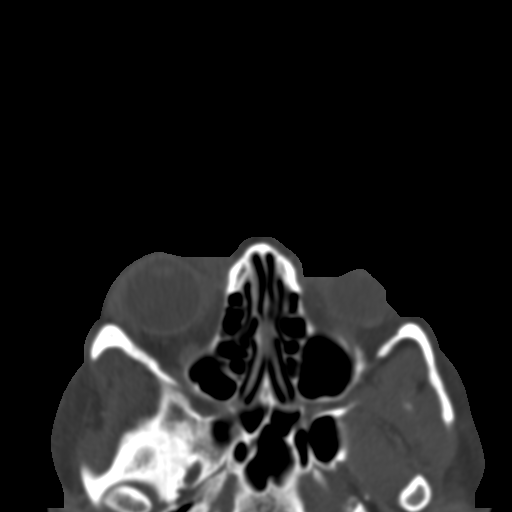
[im 35/49  bone]
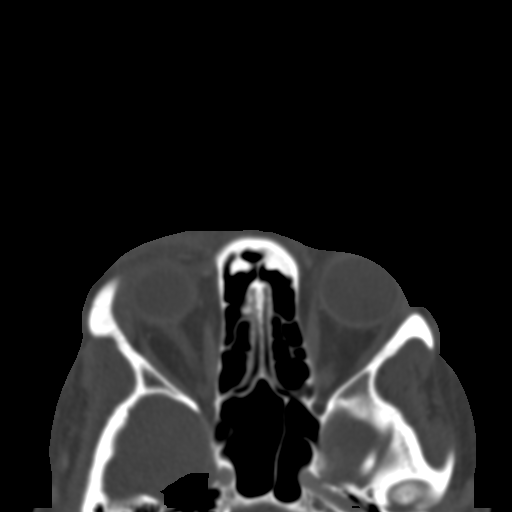
[im 40/49  bone]
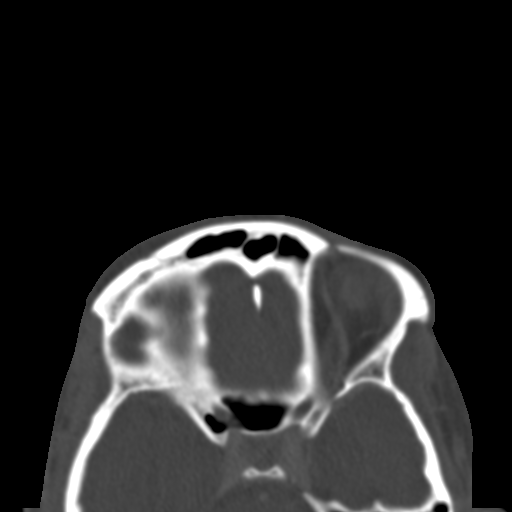
[im 45/49  brain]
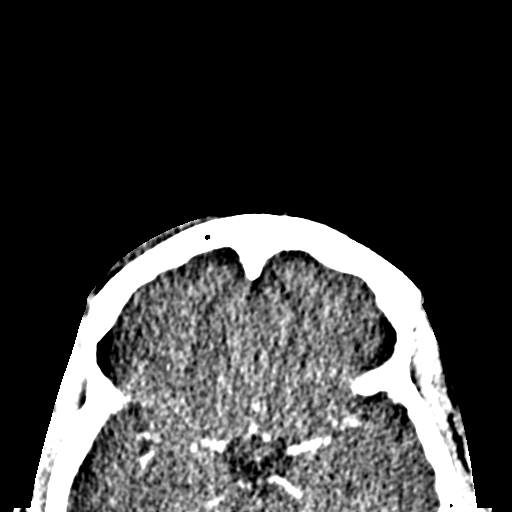
[im 45/49  bone]
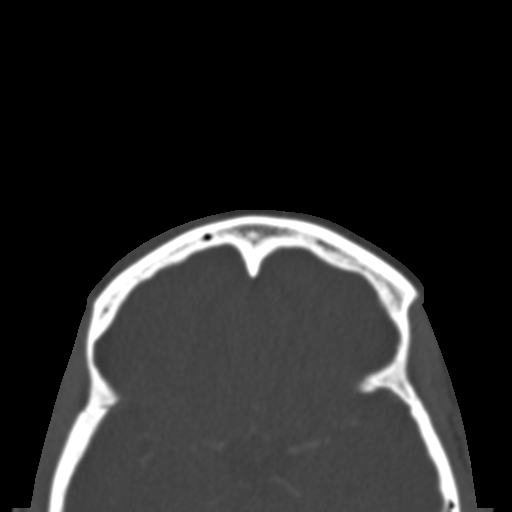

[Series 8: orbits 2.0 coronal · coronal · 0.22mm/px · 3 of 60 slices shown]
[im 20/60  bone]
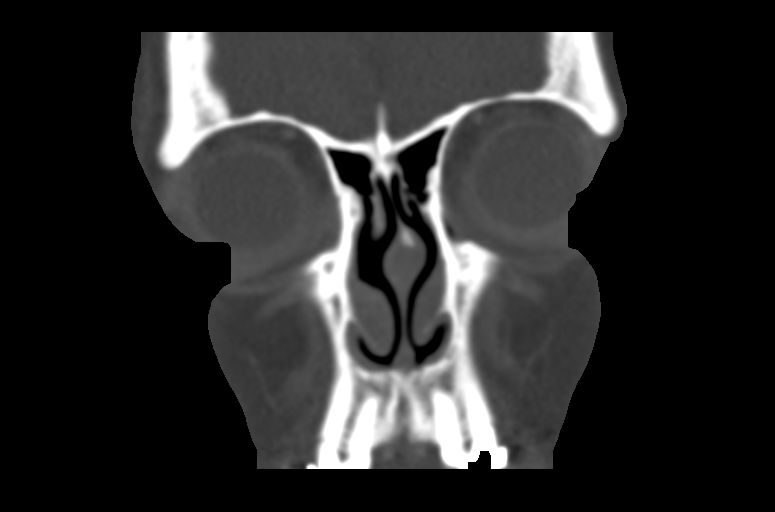
[im 27/60  bone]
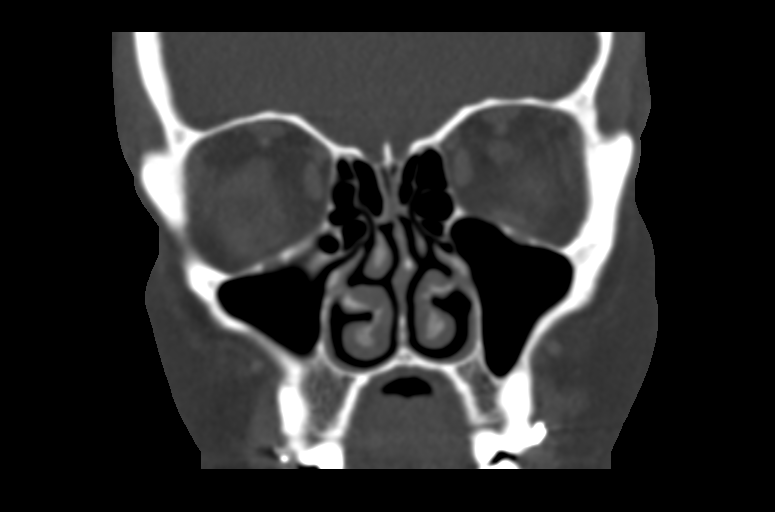
[im 33/60  bone]
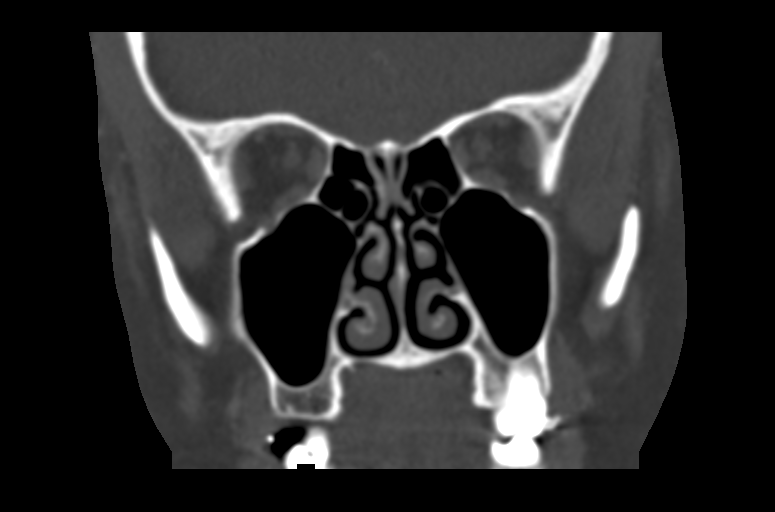

[Series 9: orbits 2.0 sagittal · sagittal · 0.22mm/px · 3 of 79 slices shown]
[im 27/79  bone]
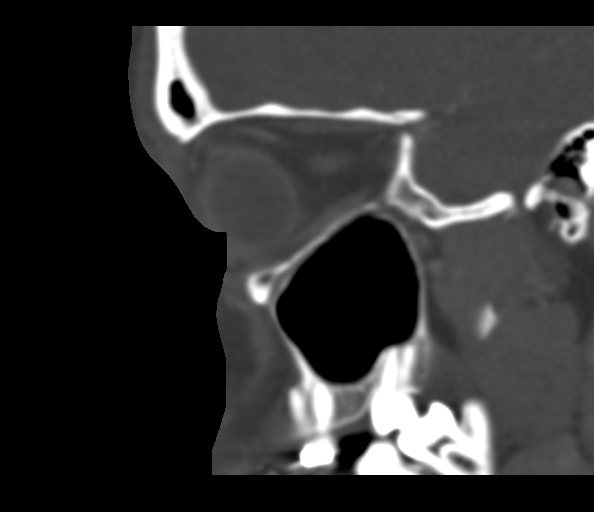
[im 40/79  bone]
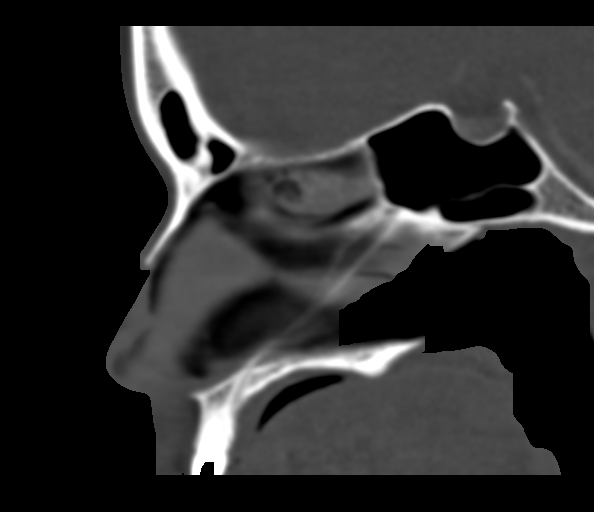
[im 53/79  bone]
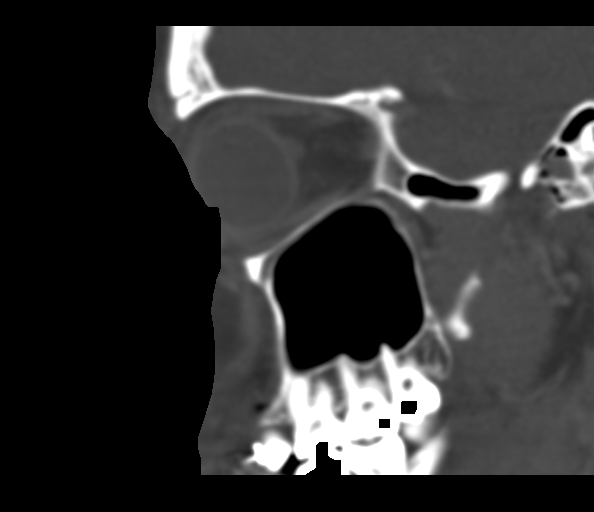

[15 of 47 positions shown; findings below may reference images not displayed]

FINDINGS: Orbits: The globes appear intact. There is mild right periorbital
soft tissue swelling. No abscess or postseptal inflammation is
identified.

Visualized sinuses: No significant inflammatory changes in the
paranasal sinuses.

Soft tissues: No additional findings.

Limited intracranial: Unremarkable.
IMPRESSION: Mild right periorbital soft tissue swelling consistent with
cellulitis. No evidence of postseptal cellulitis or abscess.

## 2021-02-12 ENCOUNTER — Other Ambulatory Visit: Payer: Self-pay

## 2021-02-12 ENCOUNTER — Encounter (HOSPITAL_BASED_OUTPATIENT_CLINIC_OR_DEPARTMENT_OTHER): Payer: Self-pay | Admitting: *Deleted

## 2021-02-12 ENCOUNTER — Emergency Department (HOSPITAL_BASED_OUTPATIENT_CLINIC_OR_DEPARTMENT_OTHER)
Admission: EM | Admit: 2021-02-12 | Discharge: 2021-02-12 | Disposition: A | Discharge status: Home / Self Care | Payer: Medicaid Other | Attending: Emergency Medicine | Admitting: Emergency Medicine

## 2021-02-12 DIAGNOSIS — N760 Acute vaginitis: Secondary | ICD-10-CM | POA: Diagnosis not present

## 2021-02-12 DIAGNOSIS — N898 Other specified noninflammatory disorders of vagina: Secondary | ICD-10-CM | POA: Diagnosis present

## 2021-02-12 DIAGNOSIS — B9689 Other specified bacterial agents as the cause of diseases classified elsewhere: Secondary | ICD-10-CM | POA: Diagnosis not present

## 2021-02-12 DIAGNOSIS — A599 Trichomoniasis, unspecified: Secondary | ICD-10-CM | POA: Insufficient documentation

## 2021-02-12 LAB — URINALYSIS, MICROSCOPIC (REFLEX)

## 2021-02-12 LAB — URINALYSIS, ROUTINE W REFLEX MICROSCOPIC
Bilirubin Urine: NEGATIVE
Glucose, UA: NEGATIVE mg/dL
Hgb urine dipstick: NEGATIVE
Nitrite: NEGATIVE
Protein, ur: 30 mg/dL — AB
Specific Gravity, Urine: 1.025 (ref 1.005–1.030)
pH: 6.5 (ref 5.0–8.0)

## 2021-02-12 LAB — WET PREP, GENITAL
Sperm: NONE SEEN
Yeast Wet Prep HPF POC: NONE SEEN

## 2021-02-12 LAB — PREGNANCY, URINE: Preg Test, Ur: NEGATIVE

## 2021-02-12 MED ORDER — METRONIDAZOLE 500 MG PO TABS: 500.0000 mg | ORAL_TABLET | Freq: Two times a day (BID) | ORAL | 0 refills | Status: DC

## 2021-02-12 MED ORDER — METRONIDAZOLE 500 MG PO TABS
500.0000 mg | ORAL_TABLET | Freq: Once | ORAL | Status: AC
  Administered 2021-02-12: 500 mg via ORAL
  Filled 2021-02-12: qty 1

## 2021-02-12 NOTE — Discharge Instructions (Addendum)
Your first dose of Flagyl was given here in the ED.  Starting tomorrow morning start taking metronidazole twice daily for the next 6-1/2 days.  Abstain from sex for the next 7 to 10 days. Your period has been irregular. Follow up with OBGN if this continues to be the case. You are also now 25 and due for a Papsmear.  Check MyChart for the results of the gonorrhea and chlamydia swab.

## 2021-02-12 NOTE — ED Triage Notes (Signed)
Pt c/o a heavy white discharge x 3 weeks; pt states her last menstral period was beginning of august; pt is unsure if she is pregnant

## 2021-02-12 NOTE — ED Provider Notes (Signed)
[facility] EMERGENCY DEPARTMENT Provider Note   CSN: [id number] Arrival date & time: 02/12/21  1921     History Chief Complaint  Patient presents with   Vaginal Discharge    Melissa Sandoval is a 25 y.o. female.   Vaginal Discharge Associated symptoms: no abdominal pain, no dysuria, no nausea and no vomiting    Patient presents with vaginal discharge.  Started on separate September 15, reports having increased quantity of her normal vaginal discharge.  Denies any changes in the color or smell.  She also reports her last menstrual cycle was in early August, she has not had any vaginal bleeding in the last 2 months.  She is worried she could be pregnant, she is complaining of breast swelling and tenderness.  Denies any abdominal pain, nausea, vomiting.  No recent change in douching or hygiene products.   Past Medical History:  Diagnosis Date   Anemia    Mood swings     There are no problems to display for this patient.   History reviewed. No pertinent surgical history.   OB History   No obstetric history on file.     History reviewed. No pertinent family history.  Social History   Tobacco Use   Smoking status: Never   Smokeless tobacco: Never  Substance Use Topics   Alcohol use: No   Drug use: No    Home Medications Prior to Admission medications   Medication Sig Start Date End Date Taking? Authorizing Provider  naproxen (NAPROSYN) 500 MG tablet Take 1 tablet (500 mg total) by mouth 2 (two) times daily. 01/24/17   Horton, Mayer Masker, MD  triamcinolone ointment (KENALOG) 0.1 % Apply 1 application topically 2 (two) times daily. Do not use for more than 7 days in a row. 07/25/18   Howard Pouch, MD    Allergies    Patient has no known allergies.  Review of Systems   Review of Systems  Gastrointestinal:  Negative for abdominal pain, nausea and vomiting.  Genitourinary:  Positive for vaginal discharge. Negative for dysuria, pelvic pain and vaginal  bleeding.   Physical Exam Updated Vital Signs BP 115/74 (BP Location: Left Arm)   Pulse 98   Temp 99.5 F (37.5 C) (Oral)   Resp 16   Ht 5\' 1"  (1.549 m)   Wt 74.8 kg   LMP 12/13/2020 (Approximate)   SpO2 100%   BMI 31.18 kg/m   Physical Exam Vitals and nursing note reviewed. Exam conducted with a chaperone present.  Constitutional:      General: She is not in acute distress.    Appearance: Normal appearance.  HENT:     Head: Normocephalic and atraumatic.  Eyes:     General: No scleral icterus.    Extraocular Movements: Extraocular movements intact.     Pupils: Pupils are equal, round, and reactive to light.  Abdominal:     General: Abdomen is flat.     Tenderness: There is no abdominal tenderness.     Comments: Abdomen is soft and nontender  Genitourinary:    Exam position: Lithotomy position.     Pubic Area: No rash.      Comments: Opaque, white vaginal discharge.  No cervical motion tenderness, no adnexal tenderness.  Doubt PID Skin:    Coloration: Skin is not jaundiced.  Neurological:     Mental Status: She is alert. Mental status is at baseline.     Coordination: Coordination normal.   ED Results / Procedures / Treatments  Labs (all labs ordered are listed, but only abnormal results are displayed) Labs Reviewed - No data to display  EKG None  Radiology No results found.  Procedures Procedures   Medications Ordered in ED Medications - No data to display  ED Course  I have reviewed the triage vital signs and the nursing notes.  Pertinent labs & imaging results that were available during my care of the patient were reviewed by me and considered in my medical decision making (see chart for details).    MDM Rules/Calculators/A&P                           Patient vitals are stable, no adnexal tenderness or cervical wall motion tenderness.  Doubt PID, copious amounts of vaginal discharge noted on pelvic exam.  Wet prep collected, will also test for GC  and chlamydia.  UA collected as well as urine pregnancy.  Patient is trichomoniasis and BV, will give first dose of Flagyl here in the ED. urine pregnancy negative.  Not a UTI.  Will treat trichomoniasis and BV, stable for discharge at this time.  Final Clinical Impression(s) / ED Diagnoses Final diagnoses:  None    Rx / DC Orders ED Discharge Orders     None        Theron Arista, PA-C 02/12/21 2130    Rolan Bucco, MD 02/12/21 2147

## 2021-02-14 ENCOUNTER — Other Ambulatory Visit: Payer: Self-pay

## 2021-02-14 ENCOUNTER — Emergency Department (HOSPITAL_BASED_OUTPATIENT_CLINIC_OR_DEPARTMENT_OTHER)
Admission: EM | Admit: 2021-02-14 | Discharge: 2021-02-14 | Disposition: A | Discharge status: Home / Self Care | Payer: Medicaid Other | Attending: Student | Admitting: Student

## 2021-02-14 DIAGNOSIS — A549 Gonococcal infection, unspecified: Secondary | ICD-10-CM | POA: Insufficient documentation

## 2021-02-14 LAB — GC/CHLAMYDIA PROBE AMP (~~LOC~~) NOT AT ARMC
Chlamydia: NEGATIVE
Comment: NEGATIVE
Comment: NORMAL
Neisseria Gonorrhea: POSITIVE — AB

## 2021-02-14 MED ORDER — DOXYCYCLINE HYCLATE 100 MG PO TABS
100.0000 mg | ORAL_TABLET | Freq: Once | ORAL | Status: AC
  Administered 2021-02-14: 100 mg via ORAL
  Filled 2021-02-14: qty 1

## 2021-02-14 MED ORDER — DOXYCYCLINE HYCLATE 100 MG PO CAPS: 100.0000 mg | ORAL_CAPSULE | Freq: Two times a day (BID) | ORAL | 0 refills | Status: DC

## 2021-02-14 MED ORDER — CEFTRIAXONE SODIUM 500 MG IJ SOLR
500.0000 mg | Freq: Once | INTRAMUSCULAR | Status: AC
  Administered 2021-02-14: 500 mg via INTRAMUSCULAR
  Filled 2021-02-14: qty 500

## 2021-02-14 NOTE — ED Triage Notes (Signed)
Pt here to gonorrhea treatment

## 2021-02-14 NOTE — ED Provider Notes (Signed)
[facility] EMERGENCY DEPARTMENT Provider Note   CSN: [id number] Arrival date & time: 02/14/21  2051     History Chief Complaint  Patient presents with   SEXUALLY TRANSMITTED DISEASE    Melissa Sandoval is a 25 y.o. female who presents the emergency department for evaluation of a positive gonorrhea test.  She was previously seen 2 days ago and diagnosed with trichomonas discharged on Flagyl but returns today for treatment for gonorrhea.  HPI     Past Medical History:  Diagnosis Date   Anemia    Mood swings     There are no problems to display for this patient.   No past surgical history on file.   OB History   No obstetric history on file.     No family history on file.  Social History   Tobacco Use   Smoking status: Never   Smokeless tobacco: Never  Substance Use Topics   Alcohol use: No   Drug use: No    Home Medications Prior to Admission medications   Medication Sig Start Date End Date Taking? Authorizing Provider  doxycycline (VIBRAMYCIN) 100 MG capsule Take 1 capsule (100 mg total) by mouth 2 (two) times daily. 02/14/21  Yes Zigmond Trela, MD  metroNIDAZOLE (FLAGYL) 500 MG tablet Take 1 tablet (500 mg total) by mouth 2 (two) times daily. 02/12/21   Theron Arista, PA-C  naproxen (NAPROSYN) 500 MG tablet Take 1 tablet (500 mg total) by mouth 2 (two) times daily. 01/24/17   Horton, Mayer Masker, MD  triamcinolone ointment (KENALOG) 0.1 % Apply 1 application topically 2 (two) times daily. Do not use for more than 7 days in a row. 07/25/18   Howard Pouch, MD    Allergies    Patient has no known allergies.  Review of Systems   Review of Systems  Constitutional:  Negative for chills and fever.  HENT:  Negative for ear pain and sore throat.   Eyes:  Negative for pain and visual disturbance.  Respiratory:  Negative for cough and shortness of breath.   Cardiovascular:  Negative for chest pain and palpitations.  Gastrointestinal:  Negative for abdominal  pain and vomiting.  Genitourinary:  Positive for vaginal discharge. Negative for dysuria and hematuria.  Musculoskeletal:  Negative for arthralgias and back pain.  Skin:  Negative for color change and rash.  Neurological:  Negative for seizures and syncope.  All other systems reviewed and are negative.  Physical Exam Updated Vital Signs BP 125/76 (BP Location: Left Arm)   Pulse (!) 114 Comment: crying  Temp 99 F (37.2 C) (Oral)   Resp 18   SpO2 100%   Physical Exam Vitals and nursing note reviewed.  Constitutional:      General: She is not in acute distress.    Appearance: She is well-developed.  HENT:     Head: Normocephalic and atraumatic.  Eyes:     Conjunctiva/sclera: Conjunctivae normal.  Cardiovascular:     Rate and Rhythm: Normal rate.     Heart sounds: No murmur heard. Pulmonary:     Effort: Pulmonary effort is normal.  Musculoskeletal:     Cervical back: Neck supple.  Skin:    General: Skin is warm and dry.  Neurological:     Mental Status: She is alert and oriented to person, place, and time. Mental status is at baseline.    ED Results / Procedures / Treatments   Labs (all labs ordered are listed, but only abnormal results are displayed) Labs  Reviewed - No data to display  EKG None  Radiology No results found.  Procedures Procedures   Medications Ordered in ED Medications  cefTRIAXone (ROCEPHIN) injection 500 mg (500 mg Intramuscular Given 02/14/21 2111)  doxycycline (VIBRA-TABS) tablet 100 mg (100 mg Oral Given 02/14/21 2114)    ED Course  I have reviewed the triage vital signs and the nursing notes.  Pertinent labs & imaging results that were available during my care of the patient were reviewed by me and considered in my medical decision making (see chart for details).    MDM Rules/Calculators/A&P                           Patient seen emergency department for evaluation of a positive gonorrhea test.  She received ceftriaxone and her  first dose of doxycycline.  She was then discharged with a weeklong course of doxycycline and encouraged to follow-up with her OB/GYN. Final Clinical Impression(s) / ED Diagnoses Final diagnoses:  Gonorrhea    Rx / DC Orders ED Discharge Orders          Ordered    doxycycline (VIBRAMYCIN) 100 MG capsule  2 times daily        02/14/21 2057             Glendora Score, MD 02/14/21 2123

## 2021-04-12 ENCOUNTER — Emergency Department (HOSPITAL_BASED_OUTPATIENT_CLINIC_OR_DEPARTMENT_OTHER)
Admission: EM | Admit: 2021-04-12 | Discharge: 2021-04-12 | Disposition: A | Discharge status: Home / Self Care | Payer: Medicaid Other | Attending: Emergency Medicine | Admitting: Emergency Medicine

## 2021-04-12 ENCOUNTER — Other Ambulatory Visit: Payer: Self-pay

## 2021-04-12 ENCOUNTER — Encounter (HOSPITAL_BASED_OUTPATIENT_CLINIC_OR_DEPARTMENT_OTHER): Payer: Self-pay | Admitting: *Deleted

## 2021-04-12 DIAGNOSIS — U071 COVID-19: Secondary | ICD-10-CM | POA: Insufficient documentation

## 2021-04-12 DIAGNOSIS — J069 Acute upper respiratory infection, unspecified: Secondary | ICD-10-CM | POA: Insufficient documentation

## 2021-04-12 DIAGNOSIS — R519 Headache, unspecified: Secondary | ICD-10-CM | POA: Diagnosis present

## 2021-04-12 LAB — RESP PANEL BY RT-PCR (FLU A&B, COVID) ARPGX2
Influenza A by PCR: NEGATIVE
Influenza B by PCR: NEGATIVE
SARS Coronavirus 2 by RT PCR: POSITIVE — AB

## 2021-04-12 NOTE — ED Provider Notes (Signed)
[facility] EMERGENCY DEPARTMENT Provider Note   CSN: [id number] Arrival date & time: 04/12/21  1347     History Chief Complaint  Patient presents with   URI    Melissa Sandoval is a 25 y.o. female with no significant past medical history who presents with 2 days of upper respiratory symptoms including headache, cough, runny nose.  He denies any nausea, vomiting.  Patient denies any fever, chills.  Patient has not taken anything for symptoms at home.  Headache is non-intractable, present all over the head.  Headache came on gradually.  Patient denies any vision changes, weakness with headache.  Patient has been around 3 other family members who are also sick with upper respiratory infectious symptoms.   URI Presenting symptoms: congestion and rhinorrhea   Associated symptoms: headaches       Past Medical History:  Diagnosis Date   Anemia    Mood swings     There are no problems to display for this patient.   History reviewed. No pertinent surgical history.   OB History   No obstetric history on file.     No family history on file.  Social History   Tobacco Use   Smoking status: Never   Smokeless tobacco: Never  Substance Use Topics   Alcohol use: Yes   Drug use: No    Home Medications Prior to Admission medications   Medication Sig Start Date End Date Taking? Authorizing Provider  doxycycline (VIBRAMYCIN) 100 MG capsule Take 1 capsule (100 mg total) by mouth 2 (two) times daily. 02/14/21   Kommor, Madison, MD  metroNIDAZOLE (FLAGYL) 500 MG tablet Take 1 tablet (500 mg total) by mouth 2 (two) times daily. 02/12/21   Theron Arista, PA-C  naproxen (NAPROSYN) 500 MG tablet Take 1 tablet (500 mg total) by mouth 2 (two) times daily. 01/24/17   Horton, Mayer Masker, MD  triamcinolone ointment (KENALOG) 0.1 % Apply 1 application topically 2 (two) times daily. Do not use for more than 7 days in a row. 07/25/18   Howard Pouch, MD    Allergies    Patient has no  known allergies.  Review of Systems   Review of Systems  HENT:  Positive for congestion and rhinorrhea.   Neurological:  Positive for headaches.  All other systems reviewed and are negative.  Physical Exam Updated Vital Signs BP 105/79 (BP Location: Right Arm)   Pulse (!) 101   Temp 99.2 F (37.3 C) (Oral)   Resp 18   Ht 5\' 1"  (1.549 m)   Wt 74.8 kg   LMP 04/05/2021   SpO2 100%   BMI 31.16 kg/m   Physical Exam Vitals and nursing note reviewed.  Constitutional:      General: She is not in acute distress.    Appearance: Normal appearance.  HENT:     Head: Normocephalic and atraumatic.  Eyes:     General:        Right eye: No discharge.        Left eye: No discharge.  Cardiovascular:     Rate and Rhythm: Normal rate and regular rhythm.  Pulmonary:     Effort: Pulmonary effort is normal. No respiratory distress.  Musculoskeletal:        General: No deformity.     Cervical back: Normal range of motion and neck supple. No rigidity.  Skin:    General: Skin is warm and dry.  Neurological:     Mental Status: She is alert and oriented  to person, place, and time.     Comments: Cranial nerves II through XII grossly intact.  Intact strength 5 out of 5 bilateral upper and lower extremities.  Psychiatric:        Mood and Affect: Mood normal.        Behavior: Behavior normal.    ED Results / Procedures / Treatments   Labs (all labs ordered are listed, but only abnormal results are displayed) Labs Reviewed  RESP PANEL BY RT-PCR (FLU A&B, COVID) ARPGX2 - Abnormal; Notable for the following components:      Result Value   SARS Coronavirus 2 by RT PCR POSITIVE (*)    All other components within normal limits    EKG None  Radiology No results found.  Procedures Procedures   Medications Ordered in ED Medications - No data to display  ED Course  I have reviewed the triage vital signs and the nursing notes.  Pertinent labs & imaging results that were available  during my care of the patient were reviewed by me and considered in my medical decision making (see chart for details).    MDM Rules/Calculators/A&P                         Overall well-appearing female with stable vital signs other than some tachycardia in triage.  Patient without tachycardia on my exam.  No red flag symptoms for headache, and neurologically intact.  No recent fever, no neck stiffness, no sudden onset, no confusion, or focal neurologic deficit.  RVP positive for COVID.  Discussed normal expectations and sequelae of COVID.  Patient does not meet criteria for antiviral medication at this time.  No respiratory distress, breath sounds normal throughout.  Encourage supportive care with fluids, Tylenol, Motrin as needed for fever, plenty of rest.  Patient understands and agrees to plan.  Encouraged to quarantine for 5 days, with 5 days of strict masking thereafter.  Return precautions given. Final Clinical Impression(s) / ED Diagnoses Final diagnoses:  COVID    Rx / DC Orders ED Discharge Orders     None        Olene Floss, PA-C 04/12/21 1723    Alvira Monday, MD 04/13/21 1226

## 2021-04-12 NOTE — Discharge Instructions (Signed)
Please use Tylenol, Motrin as needed for fever.  Please get plenty of rest, plenty of fluids.  Current recommendations for the flu are to remain out of work for 24 hours without a fever without having to use Tylenol or Motrin.  Please return or see your pediatrician if your symptoms worsen or fail to improve.

## 2021-04-12 NOTE — ED Triage Notes (Signed)
Headache, cough and runny nose x 2 days.

## 2021-09-07 ENCOUNTER — Other Ambulatory Visit: Payer: Self-pay

## 2021-09-07 ENCOUNTER — Emergency Department (HOSPITAL_BASED_OUTPATIENT_CLINIC_OR_DEPARTMENT_OTHER)
Admission: EM | Admit: 2021-09-07 | Discharge: 2021-09-07 | Disposition: A | Discharge status: Home / Self Care | Payer: Medicaid Other | Attending: Emergency Medicine | Admitting: Emergency Medicine

## 2021-09-07 ENCOUNTER — Encounter (HOSPITAL_BASED_OUTPATIENT_CLINIC_OR_DEPARTMENT_OTHER): Payer: Self-pay | Admitting: Emergency Medicine

## 2021-09-07 DIAGNOSIS — E871 Hypo-osmolality and hyponatremia: Secondary | ICD-10-CM | POA: Insufficient documentation

## 2021-09-07 DIAGNOSIS — O1212 Gestational proteinuria, second trimester: Secondary | ICD-10-CM | POA: Diagnosis not present

## 2021-09-07 DIAGNOSIS — Y990 Civilian activity done for income or pay: Secondary | ICD-10-CM | POA: Insufficient documentation

## 2021-09-07 DIAGNOSIS — Z3A20 20 weeks gestation of pregnancy: Secondary | ICD-10-CM

## 2021-09-07 DIAGNOSIS — E876 Hypokalemia: Secondary | ICD-10-CM | POA: Diagnosis not present

## 2021-09-07 DIAGNOSIS — E1110 Type 2 diabetes mellitus with ketoacidosis without coma: Secondary | ICD-10-CM | POA: Diagnosis present

## 2021-09-07 DIAGNOSIS — R55 Syncope and collapse: Secondary | ICD-10-CM | POA: Insufficient documentation

## 2021-09-07 DIAGNOSIS — O26892 Other specified pregnancy related conditions, second trimester: Secondary | ICD-10-CM | POA: Diagnosis present

## 2021-09-07 LAB — URINALYSIS, ROUTINE W REFLEX MICROSCOPIC
Bilirubin Urine: NEGATIVE
Glucose, UA: NEGATIVE mg/dL
Hgb urine dipstick: NEGATIVE
Ketones, ur: NEGATIVE mg/dL
Leukocytes,Ua: NEGATIVE
Nitrite: NEGATIVE
Protein, ur: 30 mg/dL — AB
Specific Gravity, Urine: 1.025 (ref 1.005–1.030)
pH: 6 (ref 5.0–8.0)

## 2021-09-07 LAB — URINALYSIS, MICROSCOPIC (REFLEX)

## 2021-09-07 LAB — BASIC METABOLIC PANEL
Anion gap: 6 (ref 5–15)
BUN: 9 mg/dL (ref 6–20)
CO2: 23 mmol/L (ref 22–32)
Calcium: 8.5 mg/dL — ABNORMAL LOW (ref 8.9–10.3)
Chloride: 107 mmol/L (ref 98–111)
Creatinine, Ser: 0.46 mg/dL (ref 0.44–1.00)
GFR, Estimated: >60 mL/min (ref >60)
Glucose, Bld: 80 mg/dL (ref 70–99)
Potassium: 3.1 mmol/L — ABNORMAL LOW (ref 3.5–5.1)
Sodium: 136 mmol/L (ref 135–145)

## 2021-09-07 LAB — CBC
HCT: 28.3 % — ABNORMAL LOW (ref 36.0–46.0)
Hemoglobin: 9.2 g/dL — ABNORMAL LOW (ref 12.0–15.0)
MCH: 25.5 pg — ABNORMAL LOW (ref 26.0–34.0)
MCHC: 32.5 g/dL (ref 30.0–36.0)
MCV: 78.4 fL — ABNORMAL LOW (ref 80.0–100.0)
Platelets: 233 10*3/uL (ref 150–400)
RBC: 3.61 MIL/uL — ABNORMAL LOW (ref 3.87–5.11)
RDW: 14.1 % (ref 11.5–15.5)
WBC: 8.3 10*3/uL (ref 4.0–10.5)
nRBC: 0 % (ref 0.0–0.2)

## 2021-09-07 LAB — CBG MONITORING, ED: Glucose-Capillary: 80 mg/dL (ref 70–99)

## 2021-09-07 LAB — PREGNANCY, URINE: Preg Test, Ur: POSITIVE — AB

## 2021-09-07 MED ORDER — POTASSIUM CHLORIDE CRYS ER 20 MEQ PO TBCR
20.0000 meq | EXTENDED_RELEASE_TABLET | Freq: Once | ORAL | Status: AC
  Administered 2021-09-07: 20 meq via ORAL
  Filled 2021-09-07: qty 1

## 2021-09-07 NOTE — ED Provider Notes (Signed)
?[facility] EMERGENCY DEPARTMENT ?Provider Note ? ? ?CSN: [id number] ?Arrival date & time: 09/07/21  7782 ? ?  ? ?History ? ?Chief Complaint  ?Patient presents with  ? Loss of Consciousness  ? ? ?Melissa Sandoval is a 26 y.o. female.  She denies any significant past medical history.  She is 5 months pregnant.  Complaining of syncopal event while at work today.  She said she felt lightheaded just before it happened and then felt woozy and coworker lowered her to the ground.  No seizure activity reported.  She said she has had a similar event 2 days ago.  No abdominal pain vaginal bleeding or discharge.  Still feeling baby move.  She is G2, P1.  She reports significant stress regarding bills to pay and lack of support from significant other.  She denies any physical abuse ? ?The history is provided by the patient.  ?Loss of Consciousness ?Episode history:  Single ?Most recent episode:  Today ?Chronicity:  Recurrent ?Context: normal activity   ?Witnessed: yes   ?Relieved by:  Lying down ?Worsened by:  Nothing ?Ineffective treatments:  None tried ?Associated symptoms: no chest pain, no difficulty breathing, no fever, no headaches, no nausea, no shortness of breath and no vomiting   ? ?  ? ?Home Medications ?Prior to Admission medications   ?Medication Sig Start Date End Date Taking? Authorizing Provider  ?doxycycline (VIBRAMYCIN) 100 MG capsule Take 1 capsule (100 mg total) by mouth 2 (two) times daily. 02/14/21   Kommor, Madison, MD  ?metroNIDAZOLE (FLAGYL) 500 MG tablet Take 1 tablet (500 mg total) by mouth 2 (two) times daily. 02/12/21   Theron Arista, PA-C  ?naproxen (NAPROSYN) 500 MG tablet Take 1 tablet (500 mg total) by mouth 2 (two) times daily. 01/24/17   Horton, Mayer Masker, MD  ?triamcinolone ointment (KENALOG) 0.1 % Apply 1 application topically 2 (two) times daily. Do not use for more than 7 days in a row. 07/25/18   Howard Pouch, MD  ?   ? ?Allergies    ?Patient has no known allergies.   ? ?Review of  Systems   ?Review of Systems  ?Constitutional:  Negative for fever.  ?Eyes:  Negative for visual disturbance.  ?Respiratory:  Negative for shortness of breath.   ?Cardiovascular:  Positive for syncope. Negative for chest pain.  ?Gastrointestinal:  Negative for nausea and vomiting.  ?Genitourinary:  Negative for vaginal bleeding and vaginal pain.  ?Musculoskeletal:  Negative for back pain.  ?Skin:  Negative for rash.  ?Neurological:  Positive for syncope. Negative for headaches.  ? ?Physical Exam ?Updated Vital Signs ?BP 106/63   Pulse 68   Temp 98.5 ?F (36.9 ?C) (Oral)   Resp 18   Ht 5\' 1"  (1.549 m)   Wt 71.7 kg   LMP 03/28/2021   SpO2 98%   BMI 29.85 kg/m?  ?Physical Exam ?Vitals and nursing note reviewed.  ?Constitutional:   ?   General: She is not in acute distress. ?   Appearance: Normal appearance. She is well-developed.  ?HENT:  ?   Head: Normocephalic and atraumatic.  ?Eyes:  ?   Conjunctiva/sclera: Conjunctivae normal.  ?Cardiovascular:  ?   Rate and Rhythm: Normal rate and regular rhythm.  ?   Heart sounds: No murmur heard. ?Pulmonary:  ?   Effort: Pulmonary effort is normal. No respiratory distress.  ?   Breath sounds: Normal breath sounds.  ?Abdominal:  ?   Palpations: Abdomen is soft.  ?   Tenderness: There  is no abdominal tenderness. There is no guarding or rebound.  ?Musculoskeletal:     ?   General: No swelling.  ?   Cervical back: Neck supple.  ?Skin: ?   General: Skin is warm and dry.  ?   Capillary Refill: Capillary refill takes less than 2 seconds.  ?Neurological:  ?   General: No focal deficit present.  ?   Mental Status: She is alert.  ? ? ?ED Results / Procedures / Treatments   ?Labs ?(all labs ordered are listed, but only abnormal results are displayed) ?Labs Reviewed  ?BASIC METABOLIC PANEL - Abnormal; Notable for the following components:  ?    Result Value  ? Potassium 3.1 (*)   ? Calcium 8.5 (*)   ? All other components within normal limits  ?CBC - Abnormal; Notable for the  following components:  ? RBC 3.61 (*)   ? Hemoglobin 9.2 (*)   ? HCT 28.3 (*)   ? MCV 78.4 (*)   ? MCH 25.5 (*)   ? All other components within normal limits  ?URINALYSIS, ROUTINE W REFLEX MICROSCOPIC - Abnormal; Notable for the following components:  ? APPearance TURBID (*)   ? Protein, ur 30 (*)   ? All other components within normal limits  ?PREGNANCY, URINE - Abnormal; Notable for the following components:  ? Preg Test, Ur POSITIVE (*)   ? All other components within normal limits  ?URINALYSIS, MICROSCOPIC (REFLEX) - Abnormal; Notable for the following components:  ? Bacteria, UA FEW (*)   ? All other components within normal limits  ?CBG MONITORING, ED  ? ? ?EKG ?EKG Interpretation ? ?Date/Time:  Wednesday September 07 2021 09:34:42 EDT ?Ventricular Rate:  78 ?PR Interval:  156 ?QRS Duration: 77 ?QT Interval:  392 ?QTC Calculation: 447 ?R Axis:   24 ?Text Interpretation: Sinus rhythm No old tracing to compare Confirmed by Meridee ScoreButler, Willard Farquharson [phone]([id number]) on 09/07/2021 9:40:44 AM ? ?Radiology ?No results found. ? ?Procedures ?Procedures  ? ? ?Medications Ordered in ED ?Medications  ?potassium chloride SA (KLOR-CON M) CR tablet 20 mEq (20 mEq Oral Given 09/07/21 1047)  ? ? ?ED Course/ Medical Decision Making/ A&P ?Clinical Course as of 09/07/21 1650  ?Wed Sep 07, 2021  ?1033 Fetal heart tones 160 [MB]  ?  ?Clinical Course User Index ?[MB] Terrilee FilesButler, Sera Hitsman C, MD  ? ?                        ?Medical Decision Making ?Amount and/or Complexity of Data Reviewed ?Labs: ordered. ? ?Risk ?Prescription drug management. ? ? ?This patient complains of dizziness syncope; this involves an extensive number of treatment ?Options and is a complaint that carries with it a high risk of complications and ?morbidity. The differential includes dehydration, anemia, arrhythmia, infection, metabolic derangement ? ?I ordered, reviewed and interpreted labs, which included CBC with normal white count, hemoglobin low will need to be trended, chemistries  with low sodium normal renal function, pregnancy test positive, urinalysis with some protein no signs of infection ?I ordered medication oral potassium and reviewed PMP when indicated. ?Previous records obtained and reviewed in epic no recent admissions ?Cardiac monitoring reviewed, normal sinus rhythm ?Social determinants considered, significant stressors ?Critical Interventions: None ? ?After the interventions stated above, I reevaluated the patient and found patient to be symptomatically improved ?Admission and further testing considered, no indications for admission at this time.  Patient counseled to follow-up with her OB.  Return instructions discussed ? ? ? ? ? ? ? ? ?  Final Clinical Impression(s) / ED Diagnoses ?Final diagnoses:  ?Syncope and collapse  ?[redacted] weeks gestation of pregnancy  ?Hypokalemia  ?Gestational proteinuria in second trimester  ? ? ?Rx / DC Orders ?ED Discharge Orders   ? ? None  ? ?  ? ? ?  ?Terrilee Files, MD ?09/07/21 1652 ? ?

## 2021-09-07 NOTE — ED Notes (Signed)
Ordered EKG completed. Results hand delivered to MD Charm Barges while in PT room at approximately [phone]. ?

## 2021-09-07 NOTE — ED Notes (Signed)
Pt provided discharge instructions and prescription information. Pt was given the opportunity to ask questions and questions were answered. Discharge signature not obtained in the setting of the COVID-19 pandemic in order to reduce high touch surfaces.  ° °

## 2021-09-07 NOTE — ED Triage Notes (Signed)
Pt arrives pov, steady gait, endorses syncope at work pta. Pt reports 5 months pregnant, 2nd pregnancy. Denies Cp, denies shob. Reports similar episode on Monday. LMP 03/29/2021 ?

## 2021-09-07 NOTE — ED Notes (Signed)
Pt aware of need for UA 

## 2021-09-07 NOTE — Discharge Instructions (Addendum)
You were seen in the emergency department for evaluation of a fainting spell.  Your lab work showed your potassium was mildly low.  You also had some blood in your urine.  Please follow-up with your obstetrician.  Return to the emergency department if any worsening or concerning symptoms. ?

## 2022-05-15 ENCOUNTER — Encounter (HOSPITAL_BASED_OUTPATIENT_CLINIC_OR_DEPARTMENT_OTHER): Payer: Self-pay | Admitting: Pediatrics

## 2022-05-15 ENCOUNTER — Emergency Department (HOSPITAL_BASED_OUTPATIENT_CLINIC_OR_DEPARTMENT_OTHER)
Admission: EM | Admit: 2022-05-15 | Discharge: 2022-05-15 | Disposition: A | Discharge status: Home / Self Care | Payer: Medicaid Other | Attending: Emergency Medicine | Admitting: Emergency Medicine

## 2022-05-15 ENCOUNTER — Other Ambulatory Visit: Payer: Self-pay

## 2022-05-15 DIAGNOSIS — J111 Influenza due to unidentified influenza virus with other respiratory manifestations: Secondary | ICD-10-CM | POA: Diagnosis not present

## 2022-05-15 DIAGNOSIS — R509 Fever, unspecified: Secondary | ICD-10-CM

## 2022-05-15 DIAGNOSIS — Z1152 Encounter for screening for COVID-19: Secondary | ICD-10-CM | POA: Insufficient documentation

## 2022-05-15 LAB — RESP PANEL BY RT-PCR (RSV, FLU A&B, COVID)  RVPGX2
Influenza A by PCR: NEGATIVE
Influenza B by PCR: POSITIVE — AB
Resp Syncytial Virus by PCR: NEGATIVE
SARS Coronavirus 2 by RT PCR: NEGATIVE

## 2022-05-15 MED ORDER — ACETAMINOPHEN 325 MG PO TABS
650.0000 mg | ORAL_TABLET | Freq: Once | ORAL | Status: AC | PRN
  Administered 2022-05-15: 650 mg via ORAL
  Filled 2022-05-15: qty 2

## 2022-05-15 NOTE — Discharge Instructions (Addendum)
It was our pleasure to provide your ER care today - we hope that you feel better.  Your flu test is positive.   Drink plenty of fluids/stay well hydrated.   Take acetaminophen and/or ibuprofen as need for fever and body aches.  Follow up with primary care doctor in two weeks if symptoms fail to improve/resolve.  Return to ER if worse, new symptoms, increased trouble breathing, or other concern.

## 2022-05-15 NOTE — ED Provider Notes (Signed)
[facility] EMERGENCY DEPARTMENT Provider Note   CSN: [id number] Arrival date & time: 05/15/22  1452     History  Chief Complaint  Patient presents with   Fever   Generalized Body Aches    Melissa Sandoval is a 27 y.o. female.  Pt c/o fever, non prod cough, body aches, congestion, in the past three days. Symptoms acute onset, moderate, persistent. No specific known ill contacts. No chest pain or sob. No abd pain or nvd. No dysuria or gu c/o. No trouble breathing or swallowing. No severe headaches.   The history is provided by the patient and medical records.  Fever Associated symptoms: congestion, cough, myalgias and rhinorrhea   Associated symptoms: no chest pain, no chills, no confusion, no diarrhea, no dysuria, no headaches, no rash, no sore throat and no vomiting        Home Medications Prior to Admission medications   Medication Sig Start Date End Date Taking? Authorizing Provider  doxycycline (VIBRAMYCIN) 100 MG capsule Take 1 capsule (100 mg total) by mouth 2 (two) times daily. 02/14/21   Kommor, Madison, MD  metroNIDAZOLE (FLAGYL) 500 MG tablet Take 1 tablet (500 mg total) by mouth 2 (two) times daily. 02/12/21   Sherrill Raring, PA-C  naproxen (NAPROSYN) 500 MG tablet Take 1 tablet (500 mg total) by mouth 2 (two) times daily. 01/24/17   Horton, Barbette Hair, MD  triamcinolone ointment (KENALOG) 0.1 % Apply 1 application topically 2 (two) times daily. Do not use for more than 7 days in a row. 07/25/18   Everrett Coombe, MD      Allergies    Patient has no known allergies.    Review of Systems   Review of Systems  Constitutional:  Positive for fever. Negative for chills.  HENT:  Positive for congestion and rhinorrhea. Negative for sore throat.   Eyes:  Negative for redness.  Respiratory:  Positive for cough. Negative for shortness of breath.   Cardiovascular:  Negative for chest pain.  Gastrointestinal:  Negative for abdominal pain, diarrhea and vomiting.   Genitourinary:  Negative for dysuria.  Musculoskeletal:  Positive for myalgias. Negative for neck pain and neck stiffness.  Skin:  Negative for rash.  Neurological:  Negative for headaches.  Hematological:  Does not bruise/bleed easily.  Psychiatric/Behavioral:  Negative for confusion.     Physical Exam Updated Vital Signs BP 99/62 (BP Location: Left Arm)   Pulse (!) 108   Temp (!) 102.5 F (39.2 C) (Oral)   Resp 18   Ht 1.549 m (5\' 1" )   Wt 79.4 kg   LMP 03/28/2021   SpO2 100%   BMI 33.07 kg/m  Physical Exam Vitals and nursing note reviewed.  Constitutional:      Appearance: Normal appearance. She is well-developed.  HENT:     Head: Atraumatic.     Right Ear: Tympanic membrane normal.     Left Ear: Tympanic membrane normal.     Nose: Congestion and rhinorrhea present.     Mouth/Throat:     Mouth: Mucous membranes are moist.  Eyes:     General: No scleral icterus.    Conjunctiva/sclera: Conjunctivae normal.  Neck:     Trachea: No tracheal deviation.     Comments: No stiffness or rigidity.  Cardiovascular:     Rate and Rhythm: Normal rate and regular rhythm.     Pulses: Normal pulses.     Heart sounds: Normal heart sounds. No murmur heard.    No friction rub. No  gallop.  Pulmonary:     Effort: Pulmonary effort is normal. No respiratory distress.     Breath sounds: Normal breath sounds.  Abdominal:     General: There is no distension.     Tenderness: There is no abdominal tenderness.  Genitourinary:    Comments: No cva tenderness.  Musculoskeletal:        General: No swelling.     Cervical back: Normal range of motion and neck supple. No rigidity. No muscular tenderness.  Lymphadenopathy:     Cervical: No cervical adenopathy.  Skin:    General: Skin is warm and dry.     Findings: No rash.  Neurological:     Mental Status: She is alert.     Comments: Alert, speech normal.   Psychiatric:        Mood and Affect: Mood normal.     ED Results / Procedures  / Treatments   Labs (all labs ordered are listed, but only abnormal results are displayed) Labs Reviewed  RESP PANEL BY RT-PCR (RSV, FLU A&B, COVID)  RVPGX2 - Abnormal; Notable for the following components:      Result Value   Influenza B by PCR POSITIVE (*)    All other components within normal limits    EKG None  Radiology No results found.  Procedures Procedures    Medications Ordered in ED Medications  acetaminophen (TYLENOL) tablet 650 mg (650 mg Oral Given 05/15/22 1506)    ED Course/ Medical Decision Making/ A&P                           Medical Decision Making Problems Addressed: Acute febrile illness: acute illness or injury with systemic symptoms that poses a threat to life or bodily functions Influenza: acute illness or injury with systemic symptoms that poses a threat to life or bodily functions  Amount and/or Complexity of Data Reviewed External Data Reviewed: notes. Labs: ordered. Decision-making details documented in ED Course.  Risk OTC drugs.   Labs sent.   Acetaminophen po. Po fluids.   Labs reviewed/interpreted by me - flu pos. Discussed w pt.   Reviewed nursing notes and prior charts for additional history.   Overall pt is well, not toxic in appearance, breathing comfortably, o2 sats 100%.    Pt appears stable for d/c.           Final Clinical Impression(s) / ED Diagnoses Final diagnoses:  None    Rx / DC Orders ED Discharge Orders     None         Lajean Saver, MD 05/15/22 1655

## 2022-05-15 NOTE — ED Triage Notes (Signed)
C/o headache, fever, chills and body aches x 3 days;

## 2022-09-05 ENCOUNTER — Other Ambulatory Visit (HOSPITAL_BASED_OUTPATIENT_CLINIC_OR_DEPARTMENT_OTHER): Payer: Self-pay

## 2022-09-05 ENCOUNTER — Encounter (HOSPITAL_BASED_OUTPATIENT_CLINIC_OR_DEPARTMENT_OTHER): Payer: Self-pay

## 2022-09-05 ENCOUNTER — Emergency Department (HOSPITAL_BASED_OUTPATIENT_CLINIC_OR_DEPARTMENT_OTHER)
Admission: EM | Admit: 2022-09-05 | Discharge: 2022-09-05 | Disposition: A | Discharge status: Home / Self Care | Payer: Medicaid Other | Attending: Emergency Medicine | Admitting: Emergency Medicine

## 2022-09-05 DIAGNOSIS — B9689 Other specified bacterial agents as the cause of diseases classified elsewhere: Secondary | ICD-10-CM | POA: Insufficient documentation

## 2022-09-05 DIAGNOSIS — N898 Other specified noninflammatory disorders of vagina: Secondary | ICD-10-CM | POA: Diagnosis present

## 2022-09-05 DIAGNOSIS — N760 Acute vaginitis: Secondary | ICD-10-CM | POA: Insufficient documentation

## 2022-09-05 LAB — WET PREP, GENITAL
Sperm: NONE SEEN
Trich, Wet Prep: NONE SEEN
WBC, Wet Prep HPF POC: >=10 — AB (ref <10)
Yeast Wet Prep HPF POC: NONE SEEN

## 2022-09-05 LAB — PREGNANCY, URINE: Preg Test, Ur: NEGATIVE

## 2022-09-05 LAB — URINALYSIS, MICROSCOPIC (REFLEX): RBC / HPF: >50 RBC/hpf (ref 0–5)

## 2022-09-05 LAB — URINALYSIS, ROUTINE W REFLEX MICROSCOPIC
Bilirubin Urine: NEGATIVE
Glucose, UA: NEGATIVE mg/dL
Ketones, ur: NEGATIVE mg/dL
Leukocytes,Ua: NEGATIVE
Nitrite: NEGATIVE
Protein, ur: 30 mg/dL — AB
Specific Gravity, Urine: 1.025 (ref 1.005–1.030)
pH: 6 (ref 5.0–8.0)

## 2022-09-05 MED ORDER — METRONIDAZOLE 500 MG PO TABS
500.0000 mg | ORAL_TABLET | Freq: Two times a day (BID) | ORAL | 0 refills | Status: AC
  Filled 2022-09-05: qty 14, 7d supply, fill #0

## 2022-09-05 NOTE — ED Notes (Signed)
ED Provider at bedside. 

## 2022-09-05 NOTE — ED Provider Notes (Signed)
[place] [facility] Provider Note   CSN: [id number] Arrival date & time: 09/05/22  0756     History  Chief Complaint  Patient presents with   Vaginal Itching    Melissa Sandoval is a 27 y.o. female.  Patient is here with vaginal itch.  She has some sores in her vaginal area and she is possibly exposed to herpes as her partner may have them.  She may be has had them before but she is not quite sure.  She denies any discharge.  She is on her menstrual cycle.  Denies any abdominal pain.  Denies any fever.  The history is provided by the patient.       Home Medications Prior to Admission medications   Medication Sig Start Date End Date Taking? Authorizing Provider  metroNIDAZOLE (FLAGYL) 500 MG tablet Take 1 tablet (500 mg total) by mouth 2 (two) times daily. 09/05/22  Yes Mathius Birkeland, DO  doxycycline (VIBRAMYCIN) 100 MG capsule Take 1 capsule (100 mg total) by mouth 2 (two) times daily. 02/14/21   Kommor, [place], MD  naproxen (NAPROSYN) 500 MG tablet Take 1 tablet (500 mg total) by mouth 2 (two) times daily. 01/24/17   Horton, Mayer Masker, MD  triamcinolone ointment (KENALOG) 0.1 % Apply 1 application topically 2 (two) times daily. Do not use for more than 7 days in a row. 07/25/18   Howard Pouch, MD      Allergies    Patient has no known allergies.    Review of Systems   Review of Systems  Physical Exam Updated Vital Signs BP 102/66 (BP Location: Left Arm)   Pulse 82   Temp 98.6 F (37 C) (Oral)   Resp 16   Ht  (1.549 m)   Wt 78.5 kg   LMP 09/02/2022 (Exact Date)   SpO2 99%   BMI 32.69 kg/m  Physical Exam Vitals and nursing note reviewed. Exam conducted with a chaperone present.  Constitutional:      General: She is not in acute distress.    Appearance: She is well-developed.  HENT:     Head: Normocephalic and atraumatic.  Eyes:     Extraocular Movements: Extraocular movements intact.     Conjunctiva/sclera:  Conjunctivae normal.     Pupils: Pupils are equal, round, and reactive to light.  Cardiovascular:     Rate and Rhythm: Normal rate and regular rhythm.     Heart sounds: No murmur heard. Pulmonary:     Effort: Pulmonary effort is normal. No respiratory distress.     Breath sounds: Normal breath sounds.  Abdominal:     Palpations: Abdomen is soft.     Tenderness: There is no abdominal tenderness.  Genitourinary:    Comments: Chaperone Candy [place], Charity fundraiser.  No obvious vesicles to suggest herpes, no obvious discharge, no cervical motion tenderness or pelvic tenderness Musculoskeletal:        General: No swelling.     Cervical back: Neck supple.  Skin:    General: Skin is warm and dry.     Capillary Refill: Capillary refill takes less than 2 seconds.  Neurological:     Mental Status: She is alert.  Psychiatric:        Mood and Affect: Mood normal.     ED Results / Procedures / Treatments   Labs (all labs ordered are listed, but only abnormal results are displayed) Labs Reviewed  WET PREP, GENITAL - Abnormal; Notable for the following components:  Result Value   Clue Cells Wet Prep HPF POC PRESENT (*)    WBC, Wet Prep HPF POC >=10 (*)    All other components within normal limits  URINALYSIS, ROUTINE W REFLEX MICROSCOPIC - Abnormal; Notable for the following components:   APPearance HAZY (*)    Hgb urine dipstick LARGE (*)    Protein, ur 30 (*)    All other components within normal limits  URINALYSIS, MICROSCOPIC (REFLEX) - Abnormal; Notable for the following components:   Bacteria, UA FEW (*)    All other components within normal limits  HSV CULTURE AND TYPING  PREGNANCY, URINE  GC/CHLAMYDIA PROBE AMP ([place]) NOT AT [facility]    EKG None  Radiology No results found.  Procedures Procedures    Medications Ordered in ED Medications - No data to display  ED Course/ Medical Decision Making/ A&P                             Medical Decision Making Amount and/or  Complexity of Data Reviewed Labs: ordered.  Risk Prescription drug management.   Melissa Sandoval is here for STD check.  Normal vitals.  No fever.  She is concerned may be about herpes.  I do not see any active lesions on exam.  Swab was sent.  Urinalysis negative for infection.  Trichomonas test negative.  Positive for bacterial vaginosis and will treat.  Gonorrhea and Chlamydia test and sent.  Patient will follow-up with her MyChart.  Will have her follow-up with OB if she feels like she develops any vesicles to get reevaluated.  Educated about STDs.  She did not want empiric treatment which I think is reasonable.  Patient discharged in good condition.  This chart was dictated using voice recognition software.  Despite best efforts to proofread,  errors can occur which can change the documentation meaning.         Final Clinical Impression(s) / ED Diagnoses Final diagnoses:  BV (bacterial vaginosis)    Rx / DC Orders ED Discharge Orders          Ordered    metroNIDAZOLE (FLAGYL) 500 MG tablet  2 times daily        09/05/22 0909              Virgina Norfolk, DO 09/05/22 [phone]

## 2022-09-05 NOTE — ED Triage Notes (Signed)
Pt reports that she has some sores in her vaginal area. States that last night she found out that her partner may have herpes

## 2022-09-06 LAB — GC/CHLAMYDIA PROBE AMP (~~LOC~~) NOT AT ARMC
Chlamydia: NEGATIVE
Comment: NEGATIVE
Comment: NORMAL
Neisseria Gonorrhea: NEGATIVE

## 2022-09-07 LAB — HSV CULTURE AND TYPING

## 2024-01-26 ENCOUNTER — Emergency Department (HOSPITAL_BASED_OUTPATIENT_CLINIC_OR_DEPARTMENT_OTHER)
Admission: EM | Admit: 2024-01-26 | Discharge: 2024-01-26 | Disposition: A | Discharge status: Home / Self Care | Attending: Emergency Medicine | Admitting: Emergency Medicine

## 2024-01-26 ENCOUNTER — Other Ambulatory Visit: Payer: Self-pay

## 2024-01-26 ENCOUNTER — Encounter (HOSPITAL_BASED_OUTPATIENT_CLINIC_OR_DEPARTMENT_OTHER): Payer: Self-pay | Admitting: Urology

## 2024-01-26 DIAGNOSIS — N898 Other specified noninflammatory disorders of vagina: Secondary | ICD-10-CM | POA: Insufficient documentation

## 2024-01-26 DIAGNOSIS — Z202 Contact with and (suspected) exposure to infections with a predominantly sexual mode of transmission: Secondary | ICD-10-CM

## 2024-01-26 LAB — URINALYSIS, MICROSCOPIC (REFLEX)

## 2024-01-26 LAB — URINALYSIS, ROUTINE W REFLEX MICROSCOPIC
Bilirubin Urine: NEGATIVE
Glucose, UA: NEGATIVE mg/dL
Hgb urine dipstick: NEGATIVE
Ketones, ur: NEGATIVE mg/dL
Nitrite: NEGATIVE
Protein, ur: 30 mg/dL — AB
Specific Gravity, Urine: 1.02 (ref 1.005–1.030)
pH: 7 (ref 5.0–8.0)

## 2024-01-26 LAB — WET PREP, GENITAL
Clue Cells Wet Prep HPF POC: NONE SEEN
Sperm: NONE SEEN
Trich, Wet Prep: NONE SEEN
WBC, Wet Prep HPF POC: >=10 — AB (ref <10)
Yeast Wet Prep HPF POC: NONE SEEN

## 2024-01-26 LAB — PREGNANCY, URINE: Preg Test, Ur: NEGATIVE

## 2024-01-26 MED ORDER — LIDOCAINE HCL (PF) 1 % IJ SOLN
2.0000 mL | Freq: Once | INTRAMUSCULAR | Status: AC
  Administered 2024-01-26: 1 mL
  Filled 2024-01-26: qty 5

## 2024-01-26 MED ORDER — CEFTRIAXONE SODIUM 500 MG IJ SOLR
500.0000 mg | Freq: Once | INTRAMUSCULAR | Status: AC
  Administered 2024-01-26: 500 mg via INTRAMUSCULAR
  Filled 2024-01-26: qty 500

## 2024-01-26 MED ORDER — DOXYCYCLINE HYCLATE 100 MG PO TABS
100.0000 mg | ORAL_TABLET | Freq: Once | ORAL | Status: AC
  Administered 2024-01-26: 100 mg via ORAL
  Filled 2024-01-26: qty 1

## 2024-01-26 MED ORDER — DOXYCYCLINE HYCLATE 100 MG PO CAPS: 100.0000 mg | ORAL_CAPSULE | Freq: Two times a day (BID) | ORAL | 0 refills | Status: AC

## 2024-01-26 NOTE — ED Triage Notes (Signed)
 Pt states vaginal itching and irritation x2 days  Denies any discharge  No recent antibiotics noted  Concern for possible std exposure  Unknown pregnancy

## 2024-01-26 NOTE — Discharge Instructions (Addendum)
 As discussed, you will need to follow-up with the Department of Public Health for the rest of your STD testing results.  Seek emergency care if experiencing any new or worsening symptoms.

## 2024-01-26 NOTE — ED Provider Notes (Signed)
  [facility] Provider Note   CSN: [id number] Arrival date & time: 01/26/24  1951     Patient presents with: Vaginal Itching   Melissa Sandoval is a 28 y.o. female with PMHx anemia who presents to ED concerned for vaginal itching x2 days. Patient concerned for STD. Denies vaginal discharge. Denies abdominal pain, nausea, vomiting, or diarrhea. Denies dysuria or hematuria.  Patient already self-swabbed prior to my initial interview.    Vaginal Itching       Prior to Admission medications   Medication Sig Start Date End Date Taking? Authorizing Provider  doxycycline  (VIBRAMYCIN ) 100 MG capsule Take 1 capsule (100 mg total) by mouth 2 (two) times daily. 01/26/24  Yes Hoy Fraction F, PA-C  metroNIDAZOLE  (FLAGYL ) 500 MG tablet Take 1 tablet (500 mg total) by mouth 2 (two) times daily. 09/05/22   Curatolo, Adam, DO  naproxen  (NAPROSYN ) 500 MG tablet Take 1 tablet (500 mg total) by mouth 2 (two) times daily. 01/24/17   Horton, Charmaine FALCON, MD  triamcinolone  ointment (KENALOG ) 0.1 % Apply 1 application topically 2 (two) times daily. Do not use for more than 7 days in a row. 07/25/18   Lanny Maxwell, MD    Allergies: Patient has no known allergies.    Review of Systems  Genitourinary:        Vaginal itching    Updated Vital Signs BP 106/73 (BP Location: Left Arm)   Pulse 83   Temp 98.1 F (36.7 C) (Oral)   Resp 18   Ht 5' 1 (1.549 m)   Wt 78.5 kg   LMP 12/20/2023   SpO2 98%   BMI 32.70 kg/m   Physical Exam Vitals and nursing note reviewed.  Constitutional:      General: She is not in acute distress.    Appearance: She is not ill-appearing or toxic-appearing.  HENT:     Head: Normocephalic and atraumatic.     Mouth/Throat:     Mouth: Mucous membranes are moist.  Eyes:     General: No scleral icterus.       Right eye: No discharge.        Left eye: No discharge.     Conjunctiva/sclera: Conjunctivae normal.   Cardiovascular:     Rate and Rhythm: Normal rate.     Pulses: Normal pulses.  Pulmonary:     Effort: Pulmonary effort is normal.  Abdominal:     General: Abdomen is flat. Bowel sounds are normal. There is no distension.     Palpations: Abdomen is soft. There is no mass.     Tenderness: There is no abdominal tenderness.  Musculoskeletal:     Right lower leg: No edema.     Left lower leg: No edema.  Skin:    General: Skin is warm and dry.     Findings: No rash.  Neurological:     General: No focal deficit present.     Mental Status: She is alert and oriented to person, place, and time. Mental status is at baseline.  Psychiatric:        Mood and Affect: Mood normal.        Behavior: Behavior normal.     (all labs ordered are listed, but only abnormal results are displayed) Labs Reviewed  WET PREP, GENITAL - Abnormal; Notable for the following components:      Result Value   WBC, Wet Prep HPF POC >=10 (*)    All other components within normal  limits  URINALYSIS, ROUTINE W REFLEX MICROSCOPIC - Abnormal; Notable for the following components:   Protein, ur 30 (*)    Leukocytes,Ua SMALL (*)    All other components within normal limits  URINALYSIS, MICROSCOPIC (REFLEX) - Abnormal; Notable for the following components:   Bacteria, UA FEW (*)    All other components within normal limits  PREGNANCY, URINE  RAPID HIV SCREEN (HIV 1/2 AB+AG)  GC/CHLAMYDIA PROBE AMP ([place]) NOT AT [facility]    EKG: None  Radiology: No results found.   Procedures   Medications Ordered in the ED  cefTRIAXone  (ROCEPHIN ) injection 500 mg (has no administration in time range)  doxycycline  (VIBRA -TABS) tablet 100 mg (has no administration in time range)                                    Medical Decision Making Amount and/or Complexity of Data Reviewed Labs: ordered.  Risk Prescription drug management.   This patient presents to the ED for concern of vaginal itching, this involves an  extensive number of treatment options, and is a complaint that carries with it a high risk of complications and morbidity.  The differential diagnosis includes yeast, BV, STD, HSV   Co morbidities that complicate the patient evaluation  None   Additional history obtained:  Dr. Lauretha PCP    Problem List / ED Course / Critical interventions / Medication management  Patient presents ED concern for vaginal itching x 2 days.  Patient already self swabbed prior to my initial interview.  Physical exam reassuring.  Patient afebrile with stable vitals. I Ordered, and personally interpreted labs.  The pertinent results include: Wet prep positive for WBC.  UPT negative.  UA does have small leukocytes and few bacteria, however does have 6/10 squamous epithelial cells - findings today are probably contaminant related.  Rest of STD panel pending. Shared decision making with patient who would like to start prophylactic treatment for STDs prior to rest of STD panel resulting.  Patient given Rocephin  injection and started on doxycycline .  Patient understands to follow-up with the Department of Public Health. I have reviewed the patients home medicines and have made adjustments as needed The patient has been appropriately medically screened and/or stabilized in the ED. I have low suspicion for any other emergent medical condition which would require further screening, evaluation or treatment in the ED or require inpatient management. At time of discharge the patient is hemodynamically stable and in no acute distress. I have discussed work-up results and diagnosis with patient and answered all questions. Patient is agreeable with discharge plan. We discussed strict return precautions for returning to the emergency department and they verbalized understanding.     Social Determinants of Health:  None      Final diagnoses:  Possible exposure to STD  Itching in the vaginal area    ED Discharge Orders           Ordered    doxycycline  (VIBRAMYCIN ) 100 MG capsule  2 times daily        01/26/24 2126               Hoy Nidia FALCON, [place] 01/26/24 2131    Ruthe Cornet, DO 01/26/24 2235

## 2024-01-26 NOTE — ED Notes (Signed)
 Pt given po intake, states unable to urinate at this time

## 2024-01-27 LAB — HIV ANTIBODY (ROUTINE TESTING W REFLEX): HIV Screen 4th Generation wRfx: NONREACTIVE

## 2024-01-28 LAB — GC/CHLAMYDIA PROBE AMP (~~LOC~~) NOT AT ARMC
Chlamydia: NEGATIVE
Comment: NEGATIVE
Comment: NORMAL
Neisseria Gonorrhea: NEGATIVE

## 2024-02-17 ENCOUNTER — Emergency Department (HOSPITAL_BASED_OUTPATIENT_CLINIC_OR_DEPARTMENT_OTHER)
Admission: EM | Admit: 2024-02-17 | Discharge: 2024-02-17 | Disposition: A | Discharge status: Home / Self Care | Attending: Emergency Medicine | Admitting: Emergency Medicine

## 2024-02-17 ENCOUNTER — Encounter (HOSPITAL_BASED_OUTPATIENT_CLINIC_OR_DEPARTMENT_OTHER): Payer: Self-pay | Admitting: Emergency Medicine

## 2024-02-17 DIAGNOSIS — N6452 Nipple discharge: Secondary | ICD-10-CM | POA: Insufficient documentation

## 2024-02-17 DIAGNOSIS — N926 Irregular menstruation, unspecified: Secondary | ICD-10-CM | POA: Diagnosis not present

## 2024-02-17 LAB — HCG, SERUM, QUALITATIVE: Preg, Serum: NEGATIVE

## 2024-02-17 NOTE — ED Provider Notes (Signed)
 [place] [facility] Provider Note   CSN: [id number] Arrival date & time: 02/17/24  1616     Patient presents with: Breast Discharge   Melissa Sandoval is a 28 y.o. female.   Patient is a 28 year old female who is concerned that she may be pregnant.  She has not had a pregnancy test since August.  Her last menstrual cycle was August 7.  She said earlier she had a little cramping like she may be coming on to her.  But she has not had any bleeding.  No discharge.  She has taken 2 pregnancy test at home that been negative.  She started having some milk discharging from her breast.  She does have a 36-year-old at home but has not had milk in her breast in over a year.  She denies any painful areas or lumps in her breast although says they feel little sore.  No fevers.       Prior to Admission medications   Medication Sig Start Date End Date Taking? Authorizing Provider  doxycycline  (VIBRAMYCIN ) 100 MG capsule Take 1 capsule (100 mg total) by mouth 2 (two) times daily. 01/26/24   Hoy Nidia FALCON, PA-C  metroNIDAZOLE  (FLAGYL ) 500 MG tablet Take 1 tablet (500 mg total) by mouth 2 (two) times daily. 09/05/22   Curatolo, Adam, DO  naproxen  (NAPROSYN ) 500 MG tablet Take 1 tablet (500 mg total) by mouth 2 (two) times daily. 01/24/17   Horton, Charmaine FALCON, MD  triamcinolone  ointment (KENALOG ) 0.1 % Apply 1 application topically 2 (two) times daily. Do not use for more than 7 days in a row. 07/25/18   Lanny Maxwell, MD    Allergies: Patient has no known allergies.    Review of Systems  Constitutional:  Negative for fever.  Gastrointestinal:  Negative for abdominal pain, nausea and vomiting.  Endocrine:       Discharge from both breasts  Genitourinary:  Negative for dysuria.  Skin:  Negative for wound.    Updated Vital Signs BP 114/73 (BP Location: Right Arm)   Pulse 89   Temp 99.2 F (37.3 C) (Oral)   Resp 20   Ht 5' 1 (1.549 m)   Wt 78.5 kg   LMP  12/20/2023   SpO2 99%   BMI 32.70 kg/m   Physical Exam Constitutional:      Appearance: She is well-developed.  HENT:     Head: Normocephalic and atraumatic.  Eyes:     Pupils: Pupils are equal, round, and reactive to light.  Cardiovascular:     Rate and Rhythm: Normal rate and regular rhythm.     Heart sounds: Normal heart sounds.  Pulmonary:     Effort: Pulmonary effort is normal. No respiratory distress.     Breath sounds: Normal breath sounds. No wheezing or rales.     Comments: Normal-appearing breast.  No redness or tenderness on palpation of the breast.  No masses are noted.  No discharges able to be expressed from the nipples.  Breast exam was done with Tawni Pepper, RN in the room Chest:     Chest wall: No tenderness.  Abdominal:     General: Bowel sounds are normal.     Palpations: Abdomen is soft.     Tenderness: There is no abdominal tenderness. There is no guarding or rebound.  Musculoskeletal:        General: Normal range of motion.     Cervical back: Normal range of motion and neck  supple.  Lymphadenopathy:     Cervical: No cervical adenopathy.  Skin:    General: Skin is warm and dry.     Findings: No rash.  Neurological:     Mental Status: She is alert and oriented to person, place, and time.     (all labs ordered are listed, but only abnormal results are displayed) Labs Reviewed  HCG, SERUM, QUALITATIVE    EKG: None  Radiology: No results found.   Procedures   Medications Ordered in the ED - No data to display                                  Medical Decision Making Amount and/or Complexity of Data Reviewed Labs: ordered.   This patient presents to the ED for concern of late menstrual period, breast discharge, this involves an extensive number of treatment options, and is a complaint that carries with it a high risk of complications and morbidity.  I considered the following differential and admission for this acute, potentially life  threatening condition.  The differential diagnosis includes mastitis, hormone abnormality, pregnancy, breast mass  MDM:    Patient is a 28 year old who presents with some breast soreness and nipple discharge which she says looks like milk coming from her nipples.  I was not able to appreciate any discharge.  She did not have any evidence of mastitis or other infection.  No tenderness to the breast on palpation.  Her pregnancy test is negative.  She is otherwise well-appearing.  Discussed with her having close follow-up with her OB/GYN.  She was discharged home in good condition.  Return precautions were given.  (Labs, imaging, consults)  Labs: I Ordered, and personally interpreted labs.  The pertinent results include: Pregnancy test negative  Imaging Studies ordered: I ordered imaging studies including none I independently visualized and interpreted imaging. I agree with the radiologist interpretation  Additional history obtained from chart.  External records from outside source obtained and reviewed including notes  Cardiac Monitoring: The patient was not maintained on a cardiac monitor.  If on the cardiac monitor, I personally viewed and interpreted the cardiac monitored which showed an underlying rhythm of:    Reevaluation: After the interventions noted above, I reevaluated the patient and found that they have :stayed the same  Social Determinants of Health:    Disposition: Discharged to home  Co morbidities that complicate the patient evaluation  Past Medical History:  Diagnosis Date   Anemia    Mood swings      Medicines No orders of the defined types were placed in this encounter.   I have reviewed the patients home medicines and have made adjustments as needed  Problem List / ED Course: Problem List Items Addressed This Visit   None Visit Diagnoses       Breast discharge    -  Primary     Irregular periods                    Final diagnoses:  None     ED Discharge Orders     None          Lenor Hollering, MD 02/17/24 8190

## 2024-02-17 NOTE — ED Triage Notes (Signed)
 Pt reports she has not had a period since Aug; 2 negative preg tests at home; c/o milky discharge from bil breasts x 1 wk; reports slight pain around nipples

## 2024-02-17 NOTE — Discharge Instructions (Addendum)
 Make an appointment to have follow-up with your OB/GYN.  Return to the emergency room if you have any worsening symptoms.
